# Patient Record
Sex: Male | Born: 1980
Health system: Southern US, Community
[De-identification: ages and names within clinical notes are randomized; demographics above are authoritative.]

## PROBLEM LIST (undated history)

## (undated) DIAGNOSIS — K859 Acute pancreatitis without necrosis or infection, unspecified: Secondary | ICD-10-CM

## (undated) DIAGNOSIS — D332 Benign neoplasm of brain, unspecified: Secondary | ICD-10-CM

## (undated) DIAGNOSIS — T7840XA Allergy, unspecified, initial encounter: Secondary | ICD-10-CM

## (undated) DIAGNOSIS — K5190 Ulcerative colitis, unspecified, without complications: Secondary | ICD-10-CM

## (undated) HISTORY — DX: Allergy, unspecified, initial encounter: T78.40XA

## (undated) HISTORY — PX: BRAIN SURGERY: SHX531

---

## 2019-01-26 ENCOUNTER — Other Ambulatory Visit: Payer: Self-pay

## 2019-01-26 ENCOUNTER — Encounter (HOSPITAL_COMMUNITY): Payer: Self-pay | Admitting: Emergency Medicine

## 2019-01-26 ENCOUNTER — Emergency Department (HOSPITAL_COMMUNITY)
Admission: EM | Admit: 2019-01-26 | Discharge: 2019-01-26 | Disposition: A | Discharge status: Home / Self Care | Payer: BLUE CROSS/BLUE SHIELD | Attending: Emergency Medicine | Admitting: Emergency Medicine

## 2019-01-26 DIAGNOSIS — J029 Acute pharyngitis, unspecified: Secondary | ICD-10-CM | POA: Diagnosis present

## 2019-01-26 DIAGNOSIS — J101 Influenza due to other identified influenza virus with other respiratory manifestations: Secondary | ICD-10-CM | POA: Diagnosis not present

## 2019-01-26 DIAGNOSIS — J111 Influenza due to unidentified influenza virus with other respiratory manifestations: Secondary | ICD-10-CM

## 2019-01-26 HISTORY — DX: Acute pancreatitis without necrosis or infection, unspecified: K85.90

## 2019-01-26 HISTORY — DX: Benign neoplasm of brain, unspecified: D33.2

## 2019-01-26 LAB — INFLUENZA PANEL BY PCR (TYPE A & B)
INFLBPCR: POSITIVE — AB
Influenza A By PCR: NEGATIVE

## 2019-01-26 MED ORDER — IBUPROFEN 800 MG PO TABS: 800.0000 mg | ORAL_TABLET | Freq: Three times a day (TID) | ORAL | 0 refills | Status: DC

## 2019-01-26 MED ORDER — PROMETHAZINE-DM 6.25-15 MG/5ML PO SYRP: 5.0000 mL | ORAL_SOLUTION | Freq: Four times a day (QID) | ORAL | 0 refills | Status: DC | PRN

## 2019-01-26 NOTE — ED Notes (Signed)
ED Provider at bedside. 

## 2019-01-26 NOTE — Discharge Instructions (Addendum)
It is important that you rest and drink plenty of fluids.  Take extra strength Tylenol every 4 hours for fever and body aches.  Cover your mouth when you cough or sneeze and avoid touching your face when possible.  Follow-up with your primary doctor for recheck or return to the ER for any worsening symptoms.

## 2019-01-26 NOTE — ED Provider Notes (Signed)
[facility] Provider Note   CSN: [id number] Arrival date & time: 01/26/19  1623     History   Chief Complaint Chief Complaint  Patient presents with  . Fever    HPI Cory James is a 38 y.o. male.  HPI   Cory James is a 38 y.o. male who presents to the Emergency Department complaining of generalized body aches, sore throat, fever, chills, and cough.  Symptoms began yesterday and worse today.  States his cough is nonproductive.  He reports intermittent shaking chills.  He has been taking over-the-counter [facility] cough drops to minimize his sore throat and cough.  He states his wife has similar symptoms and his job requires that he be exposed to the public.  He denies abdominal pain, vomiting, diarrhea, neck pain or stiffness, chest pain and shortness of breath.  He did not take a flu vaccine this season.   Past Medical History:  Diagnosis Date  . Brain tumor (benign) ([facility])   . Pancreatitis     There are no active problems to display for this patient.   Past Surgical History:  Procedure Laterality Date  . BRAIN SURGERY          Home Medications    Prior to Admission medications   Medication Sig Start Date End Date Taking? Authorizing Provider  ibuprofen (ADVIL,MOTRIN) 800 MG tablet Take 1 tablet (800 mg total) by mouth 3 (three) times daily. 01/26/19   Shali Vesey, PA-C  promethazine-dextromethorphan (PROMETHAZINE-DM) 6.25-15 MG/5ML syrup Take 5 mLs by mouth 4 (four) times daily as needed. 01/26/19   Kem Parkinson, PA-C    Family History Family History  Problem Relation Age of Onset  . Hypertension Mother   . Diabetes Mother     Social History Social History   Tobacco Use  . Smoking status: Never Smoker  . Smokeless tobacco: Never Used  Substance Use Topics  . Alcohol use: Yes    Comment: rarely  . Drug use: Never     Allergies   Patient has no known allergies.   Review of Systems Review of Systems  Constitutional:  Positive for chills and fever. Negative for activity change and appetite change.  HENT: Positive for congestion and sore throat. Negative for ear pain, facial swelling, rhinorrhea and trouble swallowing.   Eyes: Negative for visual disturbance.  Respiratory: Positive for cough. Negative for chest tightness, shortness of breath, wheezing and stridor.   Cardiovascular: Negative for chest pain.  Gastrointestinal: Negative for abdominal pain, nausea and vomiting.  Genitourinary: Negative for decreased urine volume and dysuria.  Musculoskeletal: Positive for myalgias (Generalized body aches). Negative for arthralgias, neck pain and neck stiffness.  Skin: Negative for rash.  Neurological: Negative for dizziness, weakness, numbness and headaches.  Hematological: Negative for adenopathy.  Psychiatric/Behavioral: Negative for confusion.     Physical Exam Updated Vital Signs BP (!) 142/96 (BP Location: Right Arm)   Pulse 93   Temp 99.1 F (37.3 C) (Oral)   Resp 18   Ht 5\' 10"  (1.778 m)   Wt 74.8 kg   SpO2 100%   BMI 23.68 kg/m   Physical Exam Vitals signs and nursing note reviewed.  Constitutional:      General: He is not in acute distress.    Appearance: Normal appearance. He is not ill-appearing.  HENT:     Head: Atraumatic.     Right Ear: Tympanic membrane and ear canal normal.     Left Ear: Tympanic membrane and ear canal normal.  Nose: Congestion present.     Mouth/Throat:     Mouth: Mucous membranes are moist.     Pharynx: Oropharynx is clear. No oropharyngeal exudate or posterior oropharyngeal erythema.  Neck:     Musculoskeletal: Normal range of motion. No neck rigidity.  Cardiovascular:     Rate and Rhythm: Normal rate and regular rhythm.     Pulses: Normal pulses.  Pulmonary:     Effort: Pulmonary effort is normal. No respiratory distress.     Breath sounds: Normal breath sounds. No wheezing or rhonchi.  Musculoskeletal: Normal range of motion.  Lymphadenopathy:       Cervical: No cervical adenopathy.  Skin:    General: Skin is warm.     Findings: No rash.  Neurological:     General: No focal deficit present.     Mental Status: He is alert. Mental status is at baseline.      ED Treatments / Results  Labs (all labs ordered are listed, but only abnormal results are displayed) Labs Reviewed  INFLUENZA PANEL BY PCR (TYPE A & B) - Abnormal; Notable for the following components:      Result Value   Influenza B By PCR POSITIVE (*)    All other components within normal limits    EKG None  Radiology No results found.  Procedures Procedures (including critical care time)  Medications Ordered in ED Medications - No data to display   Initial Impression / Assessment and Plan / ED Course  I have reviewed the triage vital signs and the nursing notes.  Pertinent labs & imaging results that were available during my care of the patient were reviewed by me and considered in my medical decision making (see chart for details).     Patient well-appearing.  Vital signs reviewed.  Flu testing is positive.  Patient counseled on hand hygiene.  He agrees to treatment plan with rest, fluids, and symptomatic treatment of cough with cough medication and ibuprofen for body aches and fever.  Patient appears appropriate for discharge home and agrees to plan.  Final Clinical Impressions(s) / ED Diagnoses   Final diagnoses:  Influenza    ED Discharge Orders         Ordered    promethazine-dextromethorphan (PROMETHAZINE-DM) 6.25-15 MG/5ML syrup  4 times daily PRN     01/26/19 1735    ibuprofen (ADVIL,MOTRIN) 800 MG tablet  3 times daily     01/26/19 1735           Kem Parkinson, PA-C 01/26/19 1747    Fredia Sorrow, MD 01/27/19 [phone]

## 2019-01-26 NOTE — ED Triage Notes (Signed)
Patient reports "high fever" since yesterday. Patient also reports cough.

## 2019-02-02 ENCOUNTER — Encounter: Payer: Self-pay | Admitting: Family Medicine

## 2019-02-02 ENCOUNTER — Ambulatory Visit (INDEPENDENT_AMBULATORY_CARE_PROVIDER_SITE_OTHER): Payer: BLUE CROSS/BLUE SHIELD | Admitting: Family Medicine

## 2019-02-02 VITALS — BP 102/66 | HR 52 | Resp 12 | Ht 70.0 in | Wt 165.1 lb

## 2019-02-02 DIAGNOSIS — Z23 Encounter for immunization: Secondary | ICD-10-CM

## 2019-02-02 DIAGNOSIS — R197 Diarrhea, unspecified: Secondary | ICD-10-CM

## 2019-02-02 DIAGNOSIS — K580 Irritable bowel syndrome with diarrhea: Secondary | ICD-10-CM | POA: Diagnosis not present

## 2019-02-02 MED ORDER — DICYCLOMINE HCL 20 MG PO TABS: 20.0000 mg | ORAL_TABLET | Freq: Three times a day (TID) | ORAL | 0 refills | Status: DC | PRN

## 2019-02-02 NOTE — Patient Instructions (Signed)
    Thank you for coming into the office today. I appreciate the opportunity to provide you with the care for your health and wellness.  Stop by the lab on the way out and get labs drawn.   1- pick up medication from pharmacy take as directed. On the bottle take for spasms, the Imodium is not working.  2-at pharmacy pick up Imodium take as directed on box. Do not exceed 16 mg daily or for longer than 10 day use.  3- at pharmacy pick up probiotics take once daily  4- STAY HYDRATED  I hope you feel better.   It was a pleasure to see you and I look forward to continuing to work together on your health and well-being. Please do not hesitate to call the office if you need care or have questions about your care.  Have a wonderful day and week.  With Gratitude,  Cherly Beach, DNP, AGNP-BC

## 2019-02-02 NOTE — Progress Notes (Signed)
New Patient Office Visit  Subjective:  Patient ID: Cory James, male    DOB: 1981-10-15  Age: 38 y.o. MRN: [id number]  CC:  Chief Complaint  Patient presents with  . New Patient (Initial Visit)    establish care    HPI Cory James is a 38 year old male who presents for new patient visit to the office.  Had to wait for insurance to that he can find a PCP to get treatment for concerns of diarrhea today. He is currently having 7-8 stools a day.  Half of those being at work.  This is impacting his ability to work.  This started back in November 2019.  Unsure of what set it off.  Does not believe it is related to anything that he eats because everything he eats makes him have to go to the bathroom.    More recently he has now started having more explosive diarrhea and noticing blood.  The first signs of blood were little bit darker.  They now appear to be a little bit brighter.  He reports that it is in the stool itself.  He does not report that is on the toilet paper as much.  He denies any mucus or floating stool.  His history of bowel movements consist of usually having 3 short bowel movements in the morning.  This was his normal for years.  The rest of the day was usually controlled.  He denies fever, pain, gas, bloating, headache, chest pain, shortness of breath, or rapid heart rate.  Has not tried anything to modify this over-the-counter.  Was concerned with the blood started.  Was not sure what he should take.  Denies that this is related to any anxiety.  Feels like all foods impacted.  Has noticed more solid pieces of food in stool.  Mostly liquidy or soft.  Usually semi formed.  Post having pancreatitis last year he had work to change his diet.  He had started intermittent fasting.  Reduce the amount of oils and fried foods and eating out.  He now practices a diet that is predominantly vegetarian.  Occasionally will eat out, Janine Limbo, pizza.  Does drink a lot of water.  Reports that  he does not want to work out right now because he is worried that it will impact him to have to go to the bathroom alot.   History of having pancreatitis last summer 2019 was diagnosed in [place] by his PCP.  Changed his diet did improve. He does not recall having any medicine at the time.  2018 he had a benign brain tumor removed.  This resulted in him losing his hearing on the right side.  He also has numbness through his neck and the right side of the face.  He reports mild headaches occasionally.  He also reports that he has had increasing forgetfulness since the tumor was removed.  He was cleared of any health concerns post removal and surgery healing.  Also reports having some sinus issues.  But is a smoker.  Stop smoking actual cigarettes.  But is now smoking the [employer].  Would like to quit some time.  Recently moved here from [place].  Last October 2019.  Lives with wife and 2 children.  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  Reports investing most of his income into this so that they can move down here.  Reports having some worry about where they live currently.  Reports questionable drug use  in the area.  But does not have the money at this time to move them but is working towards savings.  Admits to be in an intermittent rare marijuana smoker.  Has been trying to cease that.  Does not smoke around his children.  Unsure of exactly when the last time he did smoke.    Past Medical History:  Diagnosis Date  . Allergy    seasonal  . Brain tumor (benign) ([facility])   . Pancreatitis     Past Surgical History:  Procedure Laterality Date  . BRAIN SURGERY      Family History  Problem Relation Age of Onset  . Hypertension Mother   . Diabetes Mother   . Healthy Father   . Healthy Sister     Social History   Socioeconomic History  . Marital status: Married    Spouse name: [name]  . Number of children: 2  . Years of education: 38  . Highest education  level: GED or equivalent  Occupational History  . Not on file  Social Needs  . Financial resource strain: Not hard at all  . Food insecurity:    Worry: Never true    Inability: Never true  . Transportation needs:    Medical: No    Non-medical: No  Tobacco Use  . Smoking status: Current Every Day Smoker    Packs/day: 0.50    Years: 18.00    Pack years: 9.00    Types: Cigarettes, E-cigarettes  . Smokeless tobacco: Never Used  Substance and Sexual Activity  . Alcohol use: Not Currently    Comment: rarely  . Drug use: Not Currently    Types: Marijuana    Comment: occassion use  . Sexual activity: Yes  Lifestyle  . Physical activity:    Days per week: 0 days    Minutes per session: 0 min  . Stress: Very much  Relationships  . Social connections:    Talks on phone: More than three times a week    Gets together: Twice a week    Attends religious service: Never    Active member of club or organization: No    Attends meetings of clubs or organizations: Never    Relationship status: Married  . Intimate partner violence:    Fear of current or ex partner: No    Emotionally abused: No    Physically abused: No    Forced sexual activity: No  Other Topics Concern  . Not on file  Social History Narrative   Married for 10 years to [place]      Two Children:   2020      [name] 55 years old      [place] 77 years old      No pets     ROS Review of Systems  Constitutional: Positive for fatigue. Negative for activity change, appetite change, chills and fever.  HENT: Positive for rhinorrhea.        Allergy related  Respiratory: Positive for shortness of breath. Negative for cough and chest tightness.        Occasional shortness of breath at times  Cardiovascular: Positive for chest pain. Negative for palpitations.  Gastrointestinal: Positive for blood in stool and diarrhea. Negative for abdominal distention, abdominal pain, anal bleeding, nausea, rectal pain and vomiting.    Endocrine: Negative for polydipsia, polyphagia and polyuria.  Genitourinary: Negative.  Negative for hematuria.  Skin: Negative.   Neurological: Negative for dizziness and headaches.  Reports having numbness and loss of sensation on the right side of of face,  loss of hearing right ear  Reports this is secondary to the brain tumor removal having to cut nerves to get to the tumor  Psychiatric/Behavioral: Negative for decreased concentration. The patient is not nervous/anxious.        Reports mild worry  Reports forgetfulness secondary to having brain tumor removed in 2018.    Objective:   Today's Vitals: BP 102/66   Pulse (!) 52   Resp 12   Ht 5\' 10"  (1.778 m)   Wt 165 lb 1.9 oz (74.9 kg)   SpO2 100% Comment: room air  BMI 23.69 kg/m   Physical Exam Vitals signs and nursing note reviewed.  Constitutional:      Appearance: Normal appearance. He is normal weight.  HENT:     Head: Normocephalic.     Right Ear: External ear normal.     Left Ear: External ear normal.     Nose: Nose normal.  Eyes:     Conjunctiva/sclera: Conjunctivae normal.  Cardiovascular:     Rate and Rhythm: Regular rhythm. Bradycardia present.     Comments: Bradycardia in the 50s Pulmonary:     Effort: Pulmonary effort is normal.     Breath sounds: Normal breath sounds.  Abdominal:     General: Bowel sounds are normal.     Palpations: Abdomen is soft.     Tenderness: There is no abdominal tenderness.  Neurological:     Mental Status: He is alert and oriented to person, place, and time.  Psychiatric:        Mood and Affect: Mood normal.        Behavior: Behavior normal.        Thought Content: Thought content normal.        Judgment: Judgment normal.     Assessment & Plan:   1. Diarrhea, unspecified type Questionable unspecified type of diarrhea that is currently going on.  Will refer to GI to help with further evaluation and assessment.  Will order CBC due to the nature of having bleeding  and unsure of how much and what kind.  Along with occasional shortness of breath that is new.  - Ambulatory referral to Gastroenterology - CBC  2. Need for immunization against influenza In need of the flu vaccine.  Provided this today in office.  - Flu Vaccine QUAD 36+ mos IM  3. Irritable bowel syndrome with diarrhea Due to the nature and the description and evaluation today the signs and symptoms are conclusive of possibly having irritable bowel syndrome with  diarrhea being the predominant symptom. He has not tried anything yet over-the-counter.  Educated about the use of Imodium and probiotics.  Due to him having the inability to miss work.  And this interfering tremendously throughout his day.  Provided him with a prescription of Bentyl to take if the Imodium was not working.  Also referring to GI. Will be drawing labs for CBC and CMP to assess what diarrhea could be having impact on his electrolyte level and his blood work since he has reports of having bleeding.  Educated about the use of Imodium, not to exceed 16 mg a day or for longer than 10-day use.  Take as directed on the box.  Instructed and educated about the use of Bentyl.  Only to be using the Bentyl if the Imodium is not working for him.  Instructed him to get probiotics as well to take  daily.  And to stay hydrated.  Appreciate GIs input and help with figuring out if this is indeed IBS with diarrhea.  Or something else going on.   - dicyclomine (BENTYL) 20 MG tablet; Take 1 tablet (20 mg total) by mouth 3 (three) times daily as needed for spasms.  Dispense: 42 tablet; Refill: 0 - COMPLETE METABOLIC PANEL WITH GFR     Follow-up: Instructed him to follow-up as needed or after GI appointment.  Perlie Mayo, NP

## 2019-02-06 LAB — COMPLETE METABOLIC PANEL WITH GFR
AG Ratio: 1.1 (calc) (ref 1.0–2.5)
ALT: 25 U/L (ref 9–46)
AST: 17 U/L (ref 10–40)
Albumin: 3.7 g/dL (ref 3.6–5.1)
Alkaline phosphatase (APISO): 70 U/L (ref 36–130)
BUN: 10 mg/dL (ref 7–25)
CO2: 26 mmol/L (ref 20–32)
Calcium: 8.7 mg/dL (ref 8.6–10.3)
Chloride: 107 mmol/L (ref 98–110)
Creat: 0.91 mg/dL (ref 0.60–1.35)
GFR, EST NON AFRICAN AMERICAN: 107 mL/min/{1.73_m2} (ref >=60)
GFR, Est African American: 123 mL/min/{1.73_m2} (ref >=60)
GLOBULIN: 3.3 g/dL (ref 1.9–3.7)
Glucose, Bld: 117 mg/dL (ref 65–139)
Potassium: 4.5 mmol/L (ref 3.5–5.3)
SODIUM: 140 mmol/L (ref 135–146)
Total Bilirubin: 0.3 mg/dL (ref 0.2–1.2)
Total Protein: 7 g/dL (ref 6.1–8.1)

## 2019-02-06 LAB — CBC
HCT: 36.1 % — ABNORMAL LOW (ref 38.5–50.0)
Hemoglobin: 11.9 g/dL — ABNORMAL LOW (ref 13.2–17.1)
MCH: 26.3 pg — ABNORMAL LOW (ref 27.0–33.0)
MCHC: 33 g/dL (ref 32.0–36.0)
MCV: 79.9 fL — ABNORMAL LOW (ref 80.0–100.0)
MPV: 11.1 fL (ref 7.5–12.5)
Platelets: 316 10*3/uL (ref 140–400)
RBC: 4.52 10*6/uL (ref 4.20–5.80)
RDW: 12.6 % (ref 11.0–15.0)
WBC: 11.3 10*3/uL — ABNORMAL HIGH (ref 3.8–10.8)

## 2019-02-06 NOTE — Addendum Note (Signed)
Addended by: Obie Dredge A on: 02/06/2019 04:58 PM   Modules accepted: Orders

## 2019-02-07 ENCOUNTER — Telehealth: Payer: Self-pay

## 2019-02-07 NOTE — Telephone Encounter (Signed)
Called and spoke with patient, advised him of the stool test Jarrett Soho had ordered for him and gave instructions on where to go to get the specimen cup and instructions. Patient verbally acknowledged understanding

## 2019-02-19 ENCOUNTER — Ambulatory Visit (INDEPENDENT_AMBULATORY_CARE_PROVIDER_SITE_OTHER): Payer: BLUE CROSS/BLUE SHIELD | Admitting: Internal Medicine

## 2019-02-19 ENCOUNTER — Ambulatory Visit (INDEPENDENT_AMBULATORY_CARE_PROVIDER_SITE_OTHER): Payer: PRIVATE HEALTH INSURANCE | Admitting: Internal Medicine

## 2019-02-19 ENCOUNTER — Encounter (INDEPENDENT_AMBULATORY_CARE_PROVIDER_SITE_OTHER): Payer: Self-pay | Admitting: Internal Medicine

## 2019-02-19 VITALS — BP 116/77 | HR 73 | Temp 98.1°F | Resp 18 | Ht 70.0 in | Wt 169.3 lb

## 2019-02-19 DIAGNOSIS — R197 Diarrhea, unspecified: Secondary | ICD-10-CM

## 2019-02-19 DIAGNOSIS — K580 Irritable bowel syndrome with diarrhea: Secondary | ICD-10-CM | POA: Diagnosis not present

## 2019-02-19 DIAGNOSIS — D649 Anemia, unspecified: Secondary | ICD-10-CM

## 2019-02-19 NOTE — Progress Notes (Signed)
Reason for consultation;  Diarrhea.  History of present illness:  Patient is 38 year old male who is been referred through courtesy of Ms. Cherly Beach, NP for GI evaluation. Patient presents with 3 months history of diarrhea.  He says prior to that he had frequent urination.  As urinary frequency has decreased he has developed diarrhea.  He is having anywhere from 5-6 stools per day.  Most of his bowel movements occur within few minutes of his meals and he also wakes up twice every night to have a bowel movement.  Some of his bowel movements are explosive.  On the [facility] stool chart he gives a score of 5, 6 and 7.  Also has been passing small amount of fresh blood with his bowel movements but not every day.  He has not experienced pain or cramping except when he took loperamide last week.  His appetite is good.  He has not lost any weight this year.  Last year when he was diagnosed with pancreatitis in July 2019 he decided to improve his eating habits and he also did intermittent fasting and lost 26 pounds. No history of recent antibiotic use or travel outside the country. He has been under a lot of stress since relocating to this area last year.  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 Review of the systems is negative for nausea vomiting fever chills or night sweats.  He does not take OTC NSAIDs. Family history is negative for inflammatory bowel disease. He saw Ms. Cherly Beach, NP about 2 weeks ago.  He has not been able to provide stool specimens yet.  He states when he took Imodium he began to have normal bowel movements but he developed abdominal pain which he describes to be aching and some bloating.  He has not she had taken dicyclomine.   Current Medications: Outpatient Encounter Medications as of 02/19/2019  Medication Sig  . loperamide (IMODIUM) 2 MG capsule Take by mouth as needed for diarrhea or loose stools.  . dicyclomine (BENTYL) 20 MG tablet Take  0.5 tablets (10 mg total) by mouth 3 (three) times daily as needed.  . [DISCONTINUED] dicyclomine (BENTYL) 20 MG tablet Take 1 tablet (20 mg total) by mouth 3 (three) times daily as needed for spasms. (Patient not taking: Reported on 02/19/2019)   No facility-administered encounter medications on file as of 02/19/2019.    Past medical history:  Surgery for benign brain tumor in October 2018 while he was living in [place]. Treated for acute pancreatitis in July 2019 when he presented with chest pain.  Etiology unknown. He was treated for influenza B last month.  Allergies:  No Known Allergies  Family history:  Father is 65 years old and in good health. Mother is 66 year old with multiple health problems including hypertension diabetes and she also had history of paralysis from which she has recovered.  He has one sister age 21 in good health other than chronic constipation.  Social history:  He is married.  He has son age 37 and daughter age 53 in good health.  Both children are in good health.  He immigrated from [place] in 1991.  In the state of [place] for 10 years.  He works in a casino.  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  His wife works 7 days a week.  Smokes cigarettes for 15 years no more than half a pack per  day and now smoking E cigarettes. He drinks alcohol no more than 2-3 drinks a month.   Physical examination:  Blood pressure 116/77, pulse 73, temperature 98.1 F (36.7 C), temperature source Oral, resp. rate 18, height 5' 10"  (1.778 m), weight 169 lb 4.8 oz (76.8 kg).  Patient is alert and in no acute distress. He has scar in right occipital region partially covered by hair. Conjunctiva is pink. Sclera is nonicteric Oropharyngeal mucosa is normal. No neck masses or thyromegaly noted. Cardiac exam with regular rhythm normal S1 and S2. No murmur or gallop noted. Lungs are clear to auscultation. Abdomen is  symmetrical.  Bowel sounds are normal.  On palpation abdomen is soft and nontender with organomegaly or masses. No LE edema or clubbing noted.  Labs/studies Results:  CBC Latest Ref Rng & Units 02/05/2019  WBC 3.8 - 10.8 Thousand/uL 11.3(H)  Hemoglobin 13.2 - 17.1 g/dL 11.9(L)  Hematocrit 38.5 - 50.0 % 36.1(L)  Platelets 140 - 400 Thousand/uL 316    CMP Latest Ref Rng & Units 02/05/2019  Glucose 65 - 139 mg/dL 117  BUN 7 - 25 mg/dL 10  Creatinine 0.60 - 1.35 mg/dL 0.91  Sodium 135 - 146 mmol/L 140  Potassium 3.5 - 5.3 mmol/L 4.5  Chloride 98 - 110 mmol/L 107  CO2 20 - 32 mmol/L 26  Calcium 8.6 - 10.3 mg/dL 8.7  Total Protein 6.1 - 8.1 g/dL 7.0  Total Bilirubin 0.2 - 1.2 mg/dL 0.3  AST 10 - 40 U/L 17  ALT 9 - 46 U/L 25    Hepatic Function Latest Ref Rng & Units 02/05/2019  Total Protein 6.1 - 8.1 g/dL 7.0  AST 10 - 40 U/L 17  ALT 9 - 46 U/L 25  Total Bilirubin 0.2 - 1.2 mg/dL 0.3     Assessment:  #1.  Patient is 38 year old male who presents with 65-monthhistory of postprandial and nocturnal diarrhea associated with rectal bleeding but no abdominal pain or weight loss.  No history of recent antibiotic use or travel outside the country.  He has been under a lot of [place] working 7 days a week.  Presentation is suggestive of irritable bowel syndrome but rectal bleeding is very similar as he has developed hemorrhoids.  If stool studies are negative he will need diagnostic colonoscopy.  #2.  Anemia.  MCV is low normal.  Anemia may be secondary to GI blood loss in setting of diarrhea and rectal bleeding.  H&H will be repeated in the next few weeks.  #3.  History of pancreatitis.  It is unclear as to the etiology.  Recommendations:  Patient advised to hold off loperamide and do do not start dicyclomine until he has provided stool samples to the lab. Once he has provided stool sample to the lab he can start  dicyclomine 10 mg before breakfast and lunch and a third dose on as-needed basis. He can use loperamide OTC 2 mg once or twice daily as needed if dicyclomine does not control his diarrhea. Request records pertaining to evaluation for episode of pancreatitis last year. Further recommendations will be made when results of stool studies available.

## 2019-02-19 NOTE — Patient Instructions (Signed)
Take dicyclomine/Bentyl 10 mg by mouth 30 minutes before breakfast and lunch and take third dose on as-needed basis. Take Imodium or loperamide 2 mg once or twice daily as needed. Physician will contact you with results of stool studies when completed. Will request records pertaining to admission for pancreatitis from hospital in [place].

## 2019-04-10 ENCOUNTER — Telehealth (INDEPENDENT_AMBULATORY_CARE_PROVIDER_SITE_OTHER): Payer: Self-pay | Admitting: Internal Medicine

## 2019-04-10 LAB — STOOL CULTURE
MICRO NUMBER:: 404428
MICRO NUMBER:: 404429
MICRO NUMBER:: 404430
SHIGA RESULT:: NOT DETECTED
SPECIMEN QUALITY:: ADEQUATE
SPECIMEN QUALITY:: ADEQUATE
SPECIMEN QUALITY:: ADEQUATE

## 2019-04-10 LAB — OVA AND PARASITE EXAMINATION
CONCENTRATE RESULT:: NONE SEEN
MICRO NUMBER:: 404353
SPECIMEN QUALITY:: ADEQUATE
TRICHROME RESULT:: NONE SEEN

## 2019-04-10 NOTE — Telephone Encounter (Signed)
Patient left message stating he hasn't heard from his stool card results  - ph# [phone]

## 2019-04-10 NOTE — Progress Notes (Signed)
Appears that the stool was clear of ova or parasites. This does not completely rule out has it is only one sample-per comment.  See if symptoms have improved. Is being seen by GI now as well.

## 2019-05-02 NOTE — Telephone Encounter (Signed)
There was no stool card brought in. Per Dr.Rehman at the time of office visit - see below  Patient advised to hold off loperamide and do do not start dicyclomine until he has provided stool samples to the lab. Once he has provided stool sample to the lab he can start dicyclomine 10 mg before breakfast and lunch and a third dose on as-needed basis. He can use loperamide OTC 2 mg once or twice daily as needed if dicyclomine does not control his diarrhea. Request records pertaining to evaluation for episode of pancreatitis last year. Further recommendations will be made when results of stool studies available.  It appears in PCP noted that Ms. Cory James the NP reviewed those results with the patient.

## 2019-11-05 ENCOUNTER — Other Ambulatory Visit: Payer: Self-pay | Admitting: Cardiology

## 2019-11-05 ENCOUNTER — Encounter: Payer: Self-pay | Admitting: Cardiology

## 2019-11-05 DIAGNOSIS — Z20822 Contact with and (suspected) exposure to covid-19: Secondary | ICD-10-CM

## 2019-11-07 LAB — NOVEL CORONAVIRUS, NAA: SARS-CoV-2, NAA: NOT DETECTED

## 2019-11-18 ENCOUNTER — Other Ambulatory Visit: Payer: Self-pay

## 2019-11-18 ENCOUNTER — Emergency Department (HOSPITAL_COMMUNITY)
Admission: EM | Admit: 2019-11-18 | Discharge: 2019-11-18 | Disposition: A | Discharge status: Home / Self Care | Payer: BC Managed Care – PPO | Attending: Emergency Medicine | Admitting: Emergency Medicine

## 2019-11-18 ENCOUNTER — Encounter (HOSPITAL_COMMUNITY): Payer: Self-pay

## 2019-11-18 DIAGNOSIS — M545 Low back pain, unspecified: Secondary | ICD-10-CM

## 2019-11-18 DIAGNOSIS — F17290 Nicotine dependence, other tobacco product, uncomplicated: Secondary | ICD-10-CM | POA: Diagnosis not present

## 2019-11-18 MED ORDER — METHOCARBAMOL 500 MG PO TABS: 500.0000 mg | ORAL_TABLET | Freq: Three times a day (TID) | ORAL | 0 refills | Status: DC | PRN

## 2019-11-18 MED ORDER — NAPROXEN 500 MG PO TABS: 500.0000 mg | ORAL_TABLET | Freq: Two times a day (BID) | ORAL | 0 refills | Status: DC

## 2019-11-18 MED ORDER — LIDOCAINE 5 % EX PTCH: 1.0000 | MEDICATED_PATCH | CUTANEOUS | 0 refills | Status: DC

## 2019-11-18 NOTE — ED Triage Notes (Signed)
Pt presents to ED with complaints of lower back pain x 1 week. Pt states it radiates down left leg.

## 2019-11-18 NOTE — ED Notes (Signed)
Back pain unrelieved by patches and OTC meds  Also reports lower back pain with numbness to L inner thigh  Denies incontinence

## 2019-11-18 NOTE — ED Provider Notes (Signed)
[facility] EMERGENCY DEPARTMENT Provider Note   CSN: [id number] Arrival date & time: 11/18/19  1432     History   Chief Complaint Chief Complaint  Patient presents with   Back Pain    HPI Cory James is a 38 y.o. male with a hx of tobacco abuse & pancreatitis who presents to the ED with complaints of back pain for the past 1 week.  Patient states pain is located to the diffuse lower back, it is constant, worse with certain positions and movements, no alleviating factors.  He has tried Tylenol, Goody powder, and icy hot without relief.  He states he does a lot of heavy lifting daily, no specific traumatic injury.  He had pain similar to this a couple of years ago which improved with a muscle relaxant.  The pain at times radiates into the left leg. Denies numbness, tingling, weakness, saddle anesthesia, incontinence to bowel/bladder, fever, chills, IV drug use, dysuria, or hx of malignant cancer (states had benign brain tumor). Patient has not had prior back surgeries.       HPI  Past Medical History:  Diagnosis Date   Allergy    seasonal   Brain tumor (benign) ([place])    Pancreatitis     There are no active problems to display for this patient.   Past Surgical History:  Procedure Laterality Date   BRAIN SURGERY          Home Medications    Prior to Admission medications   Medication Sig Start Date End Date Taking? Authorizing Provider  dicyclomine (BENTYL) 20 MG tablet Take 0.5 tablets (10 mg total) by mouth 3 (three) times daily as needed. 02/19/19   Rogene Houston, MD  loperamide (IMODIUM) 2 MG capsule Take by mouth as needed for diarrhea or loose stools.    [provider]    Family History Family History  Problem Relation Age of Onset   Hypertension Mother    Diabetes Mother    Healthy Father    Healthy Sister     Social History Social History   Tobacco Use   Smoking status: Current Every Day Smoker    Packs/day: 0.50   Years: 18.00    Pack years: 9.00    Types: E-cigarettes   Smokeless tobacco: Never Used  Substance Use Topics   Alcohol use: Not Currently    Comment: rarely   Drug use: Not Currently    Types: Marijuana    Comment: occassion use     Allergies   Patient has no known allergies.   Review of Systems Review of Systems  Constitutional: Negative for chills, fever and unexpected weight change.  Gastrointestinal: Negative for abdominal pain, nausea and vomiting.  Genitourinary: Negative for dysuria.  Musculoskeletal: Positive for back pain.  Neurological: Negative for weakness and numbness.       Negative for saddle anesthesia or bowel/bladder incontinence.      Physical Exam Updated Vital Signs BP (!) 154/90 (BP Location: Right Arm)    Pulse 75    Temp 99.1 F (37.3 C) (Oral)    Resp 16    Ht 5' 10"  (1.778 m)    Wt 79.4 kg    SpO2 100%    BMI 25.11 kg/m   Physical Exam Constitutional:      General: He is not in acute distress.    Appearance: He is well-developed. He is not toxic-appearing.  HENT:     Head: Normocephalic and atraumatic.  Neck:  Musculoskeletal: Normal range of motion and neck supple. No spinous process tenderness or muscular tenderness.  Musculoskeletal:     Comments: No obvious deformity, appreciable swelling, erythema, ecchymosis, significant open wounds, or increased warmth.  Extremities: Normal ROM. Nontender.  Back: No point/focal vertebral tenderness, no palpable step off or crepitus.  Patient has bilateral lumbar paraspinal muscle tenderness.  Skin:    General: Skin is warm and dry.     Findings: No rash.  Neurological:     Mental Status: He is alert.     Deep Tendon Reflexes:     Reflex Scores:      Patellar reflexes are 2+ on the right side and 2+ on the left side.    Comments: Sensation grossly intact to bilateral lower extremities. 5/5 symmetric strength with plantar/dorsiflexion bilaterally. Gait is intact without obvious foot drop.      ED Treatments / Results  Labs (all labs ordered are listed, but only abnormal results are displayed) Labs Reviewed - No data to display  EKG None  Radiology No results found.  Procedures Procedures (including critical care time)  Medications Ordered in ED Medications - No data to display   Initial Impression / Assessment and Plan / ED Course  I have reviewed the triage vital signs and the nursing notes.  Pertinent labs & imaging results that were available during my care of the patient were reviewed by me and considered in my medical decision making (see chart for details).  Patient presents with complaint of back pain.  Patient is nontoxic appearing, vitals are WNL. Patient has normal neurologic exam, no point/focal midline tenderness to palpation. He is ambulatory in the ED.  No back pain red flags. No urinary sxs. Most likely muscle strain versus spasm. Considered disc disease, UTI/pyelonephritis, kidney stone, aortic aneurysm/dissection, cauda equina or epidural abscess however these do not fit clinical picture at this time. Will treat with Lidoderm, Naproxen, and Robaxin, discussed with patient that they are not to drive or operate heavy machinery while taking Robaxin. I discussed treatment plan, need for PCP follow-up, and return precautions with the patient. Provided opportunity for questions, patient confirmed understanding and is in agreement with plan.    Final Clinical Impressions(s) / ED Diagnoses   Final diagnoses:  Acute bilateral low back pain, unspecified whether sciatica present    ED Discharge Orders         Ordered    naproxen (NAPROSYN) 500 MG tablet  2 times daily     11/18/19 1614    methocarbamol (ROBAXIN) 500 MG tablet  Every 8 hours PRN     11/18/19 1614    lidocaine (LIDODERM) 5 %  Every 24 hours     11/18/19 1614           Odester Nilson, Milan R, PA-C 11/18/19 1615    Virgel Manifold, MD 11/18/19 1655

## 2019-11-18 NOTE — Discharge Instructions (Signed)
You were seen in the emergency department for back pain today.  At this time we suspect that your pain is related to a muscle strain/spasm.   I have prescribed you an anti-inflammatory medication and a muscle relaxer.  - Naproxen is a nonsteroidal anti-inflammatory medication that will help with pain and swelling. Be sure to take this medication as prescribed with food, 1 pill every 12 hours,  It should be taken with food, as it can cause stomach upset, and more seriously, stomach bleeding. Do not take other nonsteroidal anti-inflammatory medications with this such as Advil, Motrin, Aleve, Mobic, Goodie Powder, or Motrin.    - Robaxin is the muscle relaxer I have prescribed, this is meant to help with muscle tightness. Be aware that this medication may make you drowsy therefore the first time you take this it should be at a time you are in an environment where you can rest. Do not drive or operate heavy machinery when taking this medication. Do not drink alcohol or take other sedating medications with this medicine such as narcotics or benzodiazepines.   - Lidoderm patch-this is a topical patch to help with numbing/soothing the area, apply 1 patch to the lower back once per day  You make take Tylenol per over the counter dosing with these medications.   We have prescribed you new medication(s) today. Discuss the medications prescribed today with your pharmacist as they can have adverse effects and interactions with your other medicines including over the counter and prescribed medications. Seek medical evaluation if you start to experience new or abnormal symptoms after taking one of these medicines, seek care immediately if you start to experience difficulty breathing, feeling of your throat closing, facial swelling, or rash as these could be indications of a more serious allergic reaction   The application of heat can help soothe the pain.   You will need to follow up with  Your primary healthcare  provider in 1-2 weeks for reassessment, if you do not have a primary care provider one is provided in your discharge instructions- you may see the [place] [facility] or call the provided phone number. However return to the ER should you develop ne or worsening symptoms or any other concerns including but not limited to severe or worsening pain, low back pain with fever, numbness, weakness, loss of bowel or bladder control, or inability to walk or urinate, you should return to the ER immediately.

## 2019-11-28 ENCOUNTER — Encounter: Payer: Self-pay | Admitting: Family Medicine

## 2019-11-28 ENCOUNTER — Other Ambulatory Visit: Payer: Self-pay

## 2019-11-28 ENCOUNTER — Ambulatory Visit: Payer: BC Managed Care – PPO | Admitting: Family Medicine

## 2019-11-28 ENCOUNTER — Ambulatory Visit (INDEPENDENT_AMBULATORY_CARE_PROVIDER_SITE_OTHER): Payer: BC Managed Care – PPO | Admitting: Family Medicine

## 2019-11-28 VITALS — BP 130/80 | HR 83 | Temp 97.6°F | Resp 15 | Ht 70.0 in | Wt 182.1 lb

## 2019-11-28 DIAGNOSIS — Z23 Encounter for immunization: Secondary | ICD-10-CM

## 2019-11-28 DIAGNOSIS — K580 Irritable bowel syndrome with diarrhea: Secondary | ICD-10-CM | POA: Diagnosis not present

## 2019-11-28 DIAGNOSIS — M545 Low back pain, unspecified: Secondary | ICD-10-CM

## 2019-11-28 MED ORDER — CYCLOBENZAPRINE HCL 5 MG PO TABS: 5.0000 mg | ORAL_TABLET | Freq: Two times a day (BID) | ORAL | 0 refills | Status: DC | PRN

## 2019-11-28 MED ORDER — DICYCLOMINE HCL 20 MG PO TABS: 10.0000 mg | ORAL_TABLET | Freq: Three times a day (TID) | ORAL | 0 refills | Status: DC | PRN

## 2019-11-28 NOTE — Patient Instructions (Addendum)
I appreciate the opportunity to provide you with care for your health and wellness. Today we discussed: back pain  Follow up: 4-6  weeks   No labs or referrals today  Flu shot today-use tylenol if arm gets sore.  I have attached some exercises for back strengthening.  Please read and try to work on these. Listen to your body, if it is too much, stop and try again the next day. Stretches can help with muscle tightness. Avoid lifting anything over 10 pounds for the rest of the week.  I hope you have a wonderful, happy, safe, and healthy Holiday Season! See you in the New Year :)  Please continue to practice social distancing to keep you, your family, and our community safe.  If you must go out, please wear a mask and practice good handwashing.  It was a pleasure to see you and I look forward to continuing to work together on your health and well-being. Please do not hesitate to call the office if you need care or have questions about your care.  Have a wonderful day and week. With Gratitude, Cherly Beach, DNP, AGNP-BC   Back Exercises These exercises help to make your trunk and back strong. They also help to keep the lower back flexible. Doing these exercises can help to prevent back pain or lessen existing pain.  If you have back pain, try to do these exercises 2-3 times each day or as told by your doctor.  As you get better, do the exercises once each day. Repeat the exercises more often as told by your doctor.  To stop back pain from coming back, do the exercises once each day, or as told by your doctor. Exercises Single knee to chest Do these steps 3-5 times in a row for each leg: 1. Lie on your back on a firm bed or the floor with your legs stretched out. 2. Bring one knee to your chest. 3. Grab your knee or thigh with both hands and hold them it in place. 4. Pull on your knee until you feel a gentle stretch in your lower back or buttocks. 5. Keep doing the stretch for  10-30 seconds. 6. Slowly let go of your leg and straighten it. Pelvic tilt Do these steps 5-10 times in a row: 1. Lie on your back on a firm bed or the floor with your legs stretched out. 2. Bend your knees so they point up to the ceiling. Your feet should be flat on the floor. 3. Tighten your lower belly (abdomen) muscles to press your lower back against the floor. This will make your tailbone point up to the ceiling instead of pointing down to your feet or the floor. 4. Stay in this position for 5-10 seconds while you gently tighten your muscles and breathe evenly. Cat-cow Do these steps until your lower back bends more easily: 1. Get on your hands and knees on a firm surface. Keep your hands under your shoulders, and keep your knees under your hips. You may put padding under your knees. 2. Let your head hang down toward your chest. Tighten (contract) the muscles in your belly. Point your tailbone toward the floor so your lower back becomes rounded like the back of a cat. 3. Stay in this position for 5 seconds. 4. Slowly lift your head. Let the muscles of your belly relax. Point your tailbone up toward the ceiling so your back forms a sagging arch like the back of a cow. 5.  Stay in this position for 5 seconds.  Press-ups Do these steps 5-10 times in a row: 1. Lie on your belly (face-down) on the floor. 2. Place your hands near your head, about shoulder-width apart. 3. While you keep your back relaxed and keep your hips on the floor, slowly straighten your arms to raise the top half of your body and lift your shoulders. Do not use your back muscles. You may change where you place your hands in order to make yourself more comfortable. 4. Stay in this position for 5 seconds. 5. Slowly return to lying flat on the floor.  Bridges Do these steps 10 times in a row: 1. Lie on your back on a firm surface. 2. Bend your knees so they point up to the ceiling. Your feet should be flat on the floor.  Your arms should be flat at your sides, next to your body. 3. Tighten your butt muscles and lift your butt off the floor until your waist is almost as high as your knees. If you do not feel the muscles working in your butt and the back of your thighs, slide your feet 1-2 inches farther away from your butt. 4. Stay in this position for 3-5 seconds. 5. Slowly lower your butt to the floor, and let your butt muscles relax. If this exercise is too easy, try doing it with your arms crossed over your chest. Belly crunches Do these steps 5-10 times in a row: 1. Lie on your back on a firm bed or the floor with your legs stretched out. 2. Bend your knees so they point up to the ceiling. Your feet should be flat on the floor. 3. Cross your arms over your chest. 4. Tip your chin a little bit toward your chest but do not bend your neck. 5. Tighten your belly muscles and slowly raise your chest just enough to lift your shoulder blades a tiny bit off of the floor. Avoid raising your body higher than that, because it can put too much stress on your low back. 6. Slowly lower your chest and your head to the floor. Back lifts Do these steps 5-10 times in a row: 1. Lie on your belly (face-down) with your arms at your sides, and rest your forehead on the floor. 2. Tighten the muscles in your legs and your butt. 3. Slowly lift your chest off of the floor while you keep your hips on the floor. Keep the back of your head in line with the curve in your back. Look at the floor while you do this. 4. Stay in this position for 3-5 seconds. 5. Slowly lower your chest and your face to the floor. Contact a doctor if:  Your back pain gets a lot worse when you do an exercise.  Your back pain does not get better 2 hours after you exercise. If you have any of these problems, stop doing the exercises. Do not do them again unless your doctor says it is okay. Get help right away if:  You have sudden, very bad back pain. If  this happens, stop doing the exercises. Do not do them again unless your doctor says it is okay. This information is not intended to replace advice given to you by your health care provider. Make sure you discuss any questions you have with your health care provider. Document Released: 01/08/2011 Document Revised: 08/31/2018 Document Reviewed: 08/31/2018 Elsevier Patient Education  2020 [employer].

## 2019-11-28 NOTE — Progress Notes (Addendum)
Subjective:     Patient ID: Cory James, male   DOB: 04/15/1981, 38 y.o.   MRN: [id number]  Cory James presents for Back Pain (x 2 weeks)  New onset of back pain for 14 days. This pain is located in the lower back with limited radiation into the Left anterior thigh. Pain score today in office is rated 4/10. At its worse the pain is a level 10/10. It has started to disturb sleep and limiting movement. It is relieved by standing and pressure on the muscle. It is aggravated by laying and bending and twist.  Modifying factors have included tylenol, robaxin, and naproxen. He did go to the emergency room back on November 29 right when this started.  That was who prescribed the Robaxin and naproxen.  He reports that not long after taking that he started having stomach cramping.  In the last 3 days he has had increased diarrhea and stomach discomfort.  Reports taking Tylenol makes his throat hurt.  He also is having a hard time taking ibuprofen as well.  Does have history of having irritable bowel.  There is no associated lower extremity numbness or weakness. There is no associated incontinence of stool or urine.  Today patient denies signs and symptoms of COVID 19 infection including fever, chills, cough, shortness of breath, and headache.  Past Medical, Surgical, Social History, Allergies, and Medications have been Reviewed. Past Medical History:  Diagnosis Date  . Allergy    seasonal  . Brain tumor (benign) ([place])   . Pancreatitis    Past Surgical History:  Procedure Laterality Date  . BRAIN SURGERY     Social History   Socioeconomic History  . Marital status: Married    Spouse name: [name]  . Number of children: 2  . Years of education: 22  . Highest education level: GED or equivalent  Occupational History  . Not on file  Social Needs  . Financial resource strain: Not hard at all  . Food insecurity    Worry: Never true    Inability: Never true  . Transportation  needs    Medical: No    Non-medical: No  Tobacco Use  . Smoking status: Current Every Day Smoker    Packs/day: 0.50    Years: 18.00    Pack years: 9.00    Types: E-cigarettes  . Smokeless tobacco: Never Used  Substance and Sexual Activity  . Alcohol use: Not Currently    Comment: rarely  . Drug use: Not Currently    Types: Marijuana    Comment: occassion use  . Sexual activity: Yes  Lifestyle  . Physical activity    Days per week: 0 days    Minutes per session: 0 min  . Stress: Very much  Relationships  . Social connections    Talks on phone: More than three times a week    Gets together: Twice a week    Attends religious service: Never    Active member of club or organization: No    Attends meetings of clubs or organizations: Never    Relationship status: Married  . Intimate partner violence    Fear of current or ex partner: No    Emotionally abused: No    Physically abused: No    Forced sexual activity: No  Other Topics Concern  . Not on file  Social History Narrative   Married for 10 years to [place]      Two Children:   2020  [name] 65 years old      [place] 1 years old      No pets     Outpatient Encounter Medications as of 11/28/2019  Medication Sig  . dicyclomine (BENTYL) 20 MG tablet Take 0.5 tablets (10 mg total) by mouth 3 (three) times daily as needed.  . lidocaine (LIDODERM) 5 % Place 1 patch onto the skin daily. Place 1 patch onto area of most pain daily. Remove & Discard patch within 12 hours  . loperamide (IMODIUM) 2 MG capsule Take by mouth as needed for diarrhea or loose stools.  . naproxen (NAPROSYN) 500 MG tablet Take 1 tablet (500 mg total) by mouth 2 (two) times daily.  . [DISCONTINUED] methocarbamol (ROBAXIN) 500 MG tablet Take 1 tablet (500 mg total) by mouth every 8 (eight) hours as needed for muscle spasms. (Patient not taking: Reported on 11/28/2019)   No facility-administered encounter medications on file as of 11/28/2019.    No Known  Allergies  Review of Systems  Constitutional: Negative for chills and fever.  HENT: Negative.   Eyes: Negative.   Respiratory: Negative.   Cardiovascular: Negative.   Gastrointestinal: Negative.   Endocrine: Negative.   Genitourinary: Negative.   Musculoskeletal: Positive for back pain.  Skin: Negative.   Allergic/Immunologic: Negative.   Neurological: Negative.   Hematological: Negative.   Psychiatric/Behavioral: Negative.   All other systems reviewed and are negative.      Objective:     BP 130/80   Pulse 83   Temp 97.6 F (36.4 C) (Oral)   Resp 15   Ht 5\' 10"  (1.778 m)   Wt 182 lb 1.9 oz (82.6 kg)   SpO2 99%   BMI 26.13 kg/m   Physical Exam Vitals signs and nursing note reviewed.  Constitutional:      Appearance: Normal appearance.  HENT:     Head: Normocephalic and atraumatic.     Right Ear: External ear normal.     Left Ear: External ear normal.     Nose: Nose normal.     Mouth/Throat:     Pharynx: Oropharynx is clear.  Eyes:     General:        Right eye: No discharge.        Left eye: No discharge.     Conjunctiva/sclera: Conjunctivae normal.  Neck:     Musculoskeletal: Normal range of motion and neck supple.  Cardiovascular:     Rate and Rhythm: Normal rate and regular rhythm.     Pulses: Normal pulses.     Heart sounds: Normal heart sounds.  Pulmonary:     Effort: Pulmonary effort is normal.     Breath sounds: Normal breath sounds.  Musculoskeletal:     Lumbar back: He exhibits decreased range of motion and spasm.  Skin:    General: Skin is warm.  Neurological:     General: No focal deficit present.     Mental Status: He is alert and oriented to person, place, and time.  Psychiatric:        Mood and Affect: Mood normal.        Behavior: Behavior normal.        Thought Content: Thought content normal.        Judgment: Judgment normal.        Assessment and Plan        1. Acute low back pain, unspecified back pain laterality,  unspecified whether sciatica present S&S consistent with acute bilateral low back pain unspecified  sciatica predominantly he is noticing that the pain is staying more on the left now.  Was seen in the emergency room back on November 29 for this.  Provided with lidocaine patch but could not afford to get this so he did not get it.  Robaxin and naproxen which she reports did not help him.  He reports that he thinks that the medicines made his stomach flareup.  Reported that in the last couple of days he did some stretching and that seems to have helped him greatly.  His pain is now 4 out of 10.  He is able to sit up without having to bend back over immediately. Provided with Flexeril and encouraged him to stop taking the naproxen and/or ibuprofen at this time.  Encouraged him to continue back exercises and stretches.  In addition to using a heating pad at night if needed.  Additionally advised for him to use Tums as his stomach is irritated.  - cyclobenzaprine (FLEXERIL) 5 MG tablet; Take 1 tablet (5 mg total) by mouth 2 (two) times daily as needed for muscle spasms.  Dispense: 60 tablet; Refill: 0  2. Need for immunization against influenza Patient was educated on the recommendation for flu vaccine. After obtaining informed consent, the vaccine was administered no adverse effects noted at time of administration. Patient provided with education on arm soreness and use of tylenol or ibuprofen (if safe) for this. Encourage to use the arm vaccine was given in to help reduce the soreness. Patient educated on the signs of a reaction to the vaccine and advised to contact the office should these occur.   - Flu Vaccine QUAD 36+ mos IM  3. Irritable bowel syndrome with diarrhea Referral back to gastroenterology, restart Bentyl.  Possible need for EGD as he complains of extreme discomfort possible inflammation in the colon area.  Hopefully Dr. Melony Overly or Associates can help figure out plan of care for him.  Appreciate  collaboration in his care please let PCP know if there is anything that we can do.  - dicyclomine (BENTYL) 20 MG tablet; Take 0.5 tablets (10 mg total) by mouth 3 (three) times daily as needed.  Dispense: 42 tablet; Refill: 0 - Ambulatory referral to Gastroenterology   Follow up: 4-6 weeks   Perlie Mayo, DNP, AGNP-BC [address] [place], [place] [id number] Office Hours: Mon-Thurs 8 am-5 pm; Fri 8 am-12 pm Office Phone:  [phone]  Office Fax: [phone]

## 2019-12-26 ENCOUNTER — Other Ambulatory Visit: Payer: Self-pay

## 2019-12-26 ENCOUNTER — Ambulatory Visit (INDEPENDENT_AMBULATORY_CARE_PROVIDER_SITE_OTHER): Payer: Self-pay | Admitting: Family Medicine

## 2019-12-26 ENCOUNTER — Encounter: Payer: Self-pay | Admitting: Family Medicine

## 2019-12-26 VITALS — BP 120/80 | HR 97 | Temp 99.0°F | Resp 15 | Ht 70.0 in | Wt 181.1 lb

## 2019-12-26 DIAGNOSIS — L709 Acne, unspecified: Secondary | ICD-10-CM | POA: Insufficient documentation

## 2019-12-26 DIAGNOSIS — K580 Irritable bowel syndrome with diarrhea: Secondary | ICD-10-CM

## 2019-12-26 DIAGNOSIS — M545 Low back pain, unspecified: Secondary | ICD-10-CM | POA: Insufficient documentation

## 2019-12-26 DIAGNOSIS — M546 Pain in thoracic spine: Secondary | ICD-10-CM

## 2019-12-26 HISTORY — DX: Acne, unspecified: L70.9

## 2019-12-26 HISTORY — DX: Pain in thoracic spine: M54.6

## 2019-12-26 MED ORDER — DICYCLOMINE HCL 20 MG PO TABS: 10.0000 mg | ORAL_TABLET | Freq: Three times a day (TID) | ORAL | 0 refills | Status: DC | PRN

## 2019-12-26 NOTE — Assessment & Plan Note (Signed)
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 Most likely related to work. Encouraged to wash face twice daily with gentle soap. He can also use a toner or acne rinse for it.  Reviewed side effects, risks and benefits of medication.   Patient acknowledged agreement and understanding of the plan.

## 2019-12-26 NOTE — Assessment & Plan Note (Signed)
Recently started 1-2 times related to eating. Someone reproducible in office. But reports it is not constant.  Encouraged to try the flexeril-as it seems muscular related.  Reviewed side effects, risks and benefits of medication.   Patient acknowledged agreement and understanding of the plan.

## 2019-12-26 NOTE — Progress Notes (Signed)
Subjective:  Patient ID: Cory James, male    DOB: August 23, 1981  Age: 39 y.o. MRN: [id number]  CC:  Chief Complaint  Patient presents with  . Back Pain    follow up pain is better      HPI  HPI  Here today for follow-up on back pain reports that it is much better in his lower back.  But he started to have some mid upper described in the thoracic area discomfort that starts in one spot that kind of spreads.  He reports that it happens predominantly at night.  Happens when he lays down.  Also he has been eating 1 or 2 times and after he swallowed he kind of felt the food hit that certain point and he can feel it then as well.  He denies having any changes in his eating or bowel habits.  Does have IBS and continues to have the diarrhea issues and has not been taking his Bentyl as he reports the pharmacy did not have it or give it to him.  He reports that he use the Flexeril for his upper back but has not tried it for his mid back pain.  Additionally he reports that he has some acne that he is unsure of what is going on or why it is happening but he does work around a lot of grease.  Would like to get back into the GI office he has not had time to follow-up with them does need to get treatment for his diarrhea as he continues to suffer from that.  Weight is stable at this time.   Today patient denies signs and symptoms of COVID 19 infection including fever, chills, cough, shortness of breath, and headache. Past Medical, Surgical, Social History, Allergies, and Medications have been Reviewed.   Past Medical History:  Diagnosis Date  . Allergy    seasonal  . Brain tumor (benign) ([place])   . Pancreatitis     Current Meds  Medication Sig  . cyclobenzaprine (FLEXERIL) 5 MG tablet Take 1 tablet (5 mg total) by mouth 2 (two) times daily as needed for muscle spasms.  Marland Kitchen dicyclomine (BENTYL) 20 MG tablet Take 0.5 tablets (10 mg total) by mouth 3 (three) times daily as needed.  . lidocaine  (LIDODERM) 5 % Place 1 patch onto the skin daily. Place 1 patch onto area of most pain daily. Remove & Discard patch within 12 hours  . loperamide (IMODIUM) 2 MG capsule Take by mouth as needed for diarrhea or loose stools.  . naproxen (NAPROSYN) 500 MG tablet Take 1 tablet (500 mg total) by mouth 2 (two) times daily.  . [DISCONTINUED] dicyclomine (BENTYL) 20 MG tablet Take 0.5 tablets (10 mg total) by mouth 3 (three) times daily as needed.    ROS:  Review of Systems  Constitutional: Negative.   HENT: Negative.   Eyes: Negative.   Respiratory: Negative.   Cardiovascular: Negative.   Gastrointestinal: Positive for diarrhea.  Genitourinary: Negative.   Musculoskeletal: Positive for back pain.       Mid upper back pain  Low back pain is better   Skin: Negative.   Neurological: Negative.   Endo/Heme/Allergies: Negative.   Psychiatric/Behavioral: Negative.   All other systems reviewed and are negative.    Objective:   Today's Vitals: BP 120/80   Pulse 97   Temp 99 F (37.2 C) (Oral)   Resp 15   Ht 5' 10"  (1.778 m)   Wt 181 lb 1.9  oz (82.2 kg)   SpO2 100%   BMI 25.99 kg/m  Vitals with BMI 12/26/2019 11/28/2019 11/18/2019  Height 5' 10"  5' 10"  5' 10"   Weight 181 lbs 2 oz 182 lbs 2 oz 175 lbs  BMI 25.99 91.63 84.66  Systolic 599 357 017  Diastolic 80 80 90  Pulse 97 83 75     Physical Exam Vitals and nursing note reviewed.  Constitutional:      Appearance: Normal appearance. He is well-developed and well-groomed.  HENT:     Head: Normocephalic and atraumatic.     Right Ear: External ear normal.     Left Ear: External ear normal.     Nose: Nose normal.     Mouth/Throat:     Mouth: Mucous membranes are moist.     Pharynx: Oropharynx is clear.  Eyes:     General:        Right eye: No discharge.        Left eye: No discharge.     Conjunctiva/sclera: Conjunctivae normal.  Cardiovascular:     Rate and Rhythm: Normal rate and regular rhythm.     Pulses: Normal  pulses.     Heart sounds: Normal heart sounds.  Pulmonary:     Effort: Pulmonary effort is normal.     Breath sounds: Normal breath sounds.  Abdominal:     General: Abdomen is flat. There is no distension.  Musculoskeletal:        General: Normal range of motion.     Cervical back: Normal range of motion and neck supple.  Skin:    General: Skin is warm.  Neurological:     General: No focal deficit present.     Mental Status: He is alert and oriented to person, place, and time.  Psychiatric:        Attention and Perception: Attention normal.        Mood and Affect: Mood normal.        Speech: Speech normal.        Behavior: Behavior normal. Behavior is cooperative.        Thought Content: Thought content normal.        Cognition and Memory: Cognition normal.        Judgment: Judgment normal.     Assessment   1. Irritable bowel syndrome with diarrhea   2. Acute low back pain, unspecified back pain laterality, unspecified whether sciatica present   3. Acute midline thoracic back pain   4. Adult acne     Tests ordered No orders of the defined types were placed in this encounter.    Plan: Please see assessment and plan per problem list above.   Meds ordered this encounter  Medications  . dicyclomine (BENTYL) 20 MG tablet    Sig: Take 0.5 tablets (10 mg total) by mouth 3 (three) times daily as needed.    Dispense:  42 tablet    Refill:  0    Order Specific Question:   Supervising Provider    Answer:   Fayrene Helper [[id number]]    Patient to follow-up in 3 months .  Perlie Mayo, NP

## 2019-12-26 NOTE — Patient Instructions (Addendum)
Happy New Year! May you have a year filled with hope, love, happiness and laughter.  I appreciate the opportunity to provide you with care for your health and wellness. Today we discussed: back pain, diarrhea and upper back pain  Follow up: 3 months   No labs or referrals today  GI office today for appt:   Pick up Bentyl once your insurance card is in.  Pick up probiotics for diarrhea, take daily for 30 days.  Please continue to practice social distancing to keep you, your family, and our community safe.  If you must go out, please wear a mask and practice good handwashing.  It was a pleasure to see you and I look forward to continuing to work together on your health and well-being. Please do not hesitate to call the office if you need care or have questions about your care.  Have a wonderful day and week. With Gratitude, Cherly Beach, DNP, AGNP-BC

## 2019-12-26 NOTE — Assessment & Plan Note (Signed)
He was Refered back to gastroenterology in start of Dec and was to restart Bentyl.   Reports he didn't restart medication, does not recall pharmacy having it. I have sent this back to pharmacy for him to get in addition to probiotics. Calling GI office today to help get him an appt. Possible need for EGD as he complains of extreme discomfort possible inflammation in the colon area.  Hopefully Dr. Melony Overly or Associates can help figure out plan of care for him.  Appreciate collaboration in his care please let PCP know if there is anything that we can do.

## 2019-12-26 NOTE — Assessment & Plan Note (Signed)
Improved

## 2020-02-05 ENCOUNTER — Encounter (INDEPENDENT_AMBULATORY_CARE_PROVIDER_SITE_OTHER): Payer: Self-pay | Admitting: Internal Medicine

## 2020-02-05 ENCOUNTER — Other Ambulatory Visit: Payer: Self-pay

## 2020-02-05 ENCOUNTER — Ambulatory Visit (INDEPENDENT_AMBULATORY_CARE_PROVIDER_SITE_OTHER): Payer: BC Managed Care – PPO | Admitting: Internal Medicine

## 2020-02-05 DIAGNOSIS — K529 Noninfective gastroenteritis and colitis, unspecified: Secondary | ICD-10-CM

## 2020-02-05 DIAGNOSIS — K580 Irritable bowel syndrome with diarrhea: Secondary | ICD-10-CM

## 2020-02-05 NOTE — Progress Notes (Signed)
Presenting complaint;  Follow-up for chronic diarrhea.  Database and subjective:  Patient is 39 year old male who was last seen in March 2020 for 26-monthhistory of diarrhea with 5-6 stools per day.  Bowel movements occurred within few minutes of his meals and also at nights.  He reported his bowel movements to be explosive.  No history of constipation.  He also reported passing small amount of blood intermittently.  He had not lost any weight. Past history was significant for pancreatitis which he suffered in July 2019 without a definite etiology.  Following bout with pancreatitis he change his eating habits and managed to lose 26 pounds by intermittent fasting.  His stool studies had been obtained by PCP and were pending at the time of his office visit.  I felt that he had IBS and hematochezia may be secondary to hemorrhoids in setting of diarrhea and explosive bowel movement. Patient was begun on dicyclomine 10 mg 2 or 3 times a day along with loperamide 2 mg once or twice a day if dicyclomine did not work.  Patient was not seen because of Covid pandemic. He did have stool studies completed in April 2020.  Stool culture was negative and stool O&P was also negative.  He says his diarrhea has gotten worse.  He is having 8-10 stools per day.  He works every day from 6 AM to 4 PM.  He says he eats very little while at work and eats large amount of food between 4 PM and 8 PM before he goes to bed.  Most of his bowel movements occur at night and sometimes he has a BM every hour.  He is still noticing small amount of blood every now and then and he generally sees 1 to 2 drops.  He has not experienced frank bleeding.  He has good appetite.  He has gained 14 pounds since his last visit of March 09, 2019.  He denies nausea vomiting fever or chills.  He does complain of feeling bloated.  He is gives a score of 5 6 and 7 to his stool on [place] stool chart.  He is also having accidents which usually occur at  night.  On few occasions he thought he was passing flatus but small amount of stool also skips.  He is not short of dicyclomine has helped but then he forgets to take it as recommended.  He is not having any side effects with dicyclomine. He has tried watching dairy products but he is not sure if it decreases stool frequency. Family history is negative for eye inflammatory bowel disease.  Current Medications: Outpatient Encounter Medications as of 02/05/2020  Medication Sig  . dicyclomine (BENTYL) 20 MG tablet Take 0.5 tablets (10 mg total) by mouth 3 (three) times daily as needed.  . Probiotic Product (PROBIOTIC PO) Take by mouth daily.  .Marland Kitchenloperamide (IMODIUM) 2 MG capsule Take by mouth as needed for diarrhea or loose stools.  . [DISCONTINUED] cyclobenzaprine (FLEXERIL) 5 MG tablet Take 1 tablet (5 mg total) by mouth 2 (two) times daily as needed for muscle spasms. (Patient not taking: Reported on 02/05/2020)  . [DISCONTINUED] lidocaine (LIDODERM) 5 % Place 1 patch onto the skin daily. Place 1 patch onto area of most pain daily. Remove & Discard patch within 12 hours (Patient not taking: Reported on 02/05/2020)  . [DISCONTINUED] naproxen (NAPROSYN) 500 MG tablet Take 1 tablet (500 mg total) by mouth 2 (two) times daily. (Patient not taking: Reported on 02/05/2020)   No  facility-administered encounter medications on file as of 02/05/2020.     Objective: Blood pressure 128/81, pulse 64, temperature 97.8 F (36.6 C), temperature source Temporal, height 5' 10"  (1.778 m), weight 183 lb 12.8 oz (83.4 kg). Patient is alert and in no acute distress. Patient is wearing a facial mask. Conjunctiva is pink. Sclera is nonicteric Oropharyngeal mucosa is normal. No neck masses or thyromegaly noted. Cardiac exam with regular rhythm normal S1 and S2. No murmur or gallop noted. Lungs are clear to auscultation. Abdomen is symmetrical.  Bowel sounds are normal.  On palpation abdomen is soft.  He has mild  tenderness in hypogastric region on deep palpation.  No organomegaly or masses. No LE edema or clubbing noted.  Assessment:  #1.  Chronic diarrhea felt to be due to IBS but he is not responding to therapy.  He is also having intermittent hematochezia which may be secondary to hemorrhoids.  Stool culture and O&P last year were negative.  He was noted to be mildly anemic on his blood work from February 2020 when his hemoglobin was 11.9. Given significant weight gain I do not believe we are dealing with small bowel diarrhea. Given this scenario need to worry about inflammatory bowel disease.  He would therefore would benefit from diagnostic colonoscopy.   Plan:  Patient advised to take 20 mg of dicyclomine 30 minutes before evening meal. Patient advised to cut back on the amount of food that he is eating every evening as it is making his diarrhea worse. Diagnostic colonoscopy to be scheduled in near future. Office visit in 3 months.

## 2020-02-05 NOTE — Patient Instructions (Signed)
Colonoscopy to be scheduled near future. Can try dicyclomine 20 mg 30 minutes before evening meal.

## 2020-02-06 ENCOUNTER — Other Ambulatory Visit (INDEPENDENT_AMBULATORY_CARE_PROVIDER_SITE_OTHER): Payer: Self-pay | Admitting: *Deleted

## 2020-02-06 ENCOUNTER — Encounter (INDEPENDENT_AMBULATORY_CARE_PROVIDER_SITE_OTHER): Payer: Self-pay | Admitting: *Deleted

## 2020-02-06 DIAGNOSIS — K529 Noninfective gastroenteritis and colitis, unspecified: Secondary | ICD-10-CM

## 2020-02-12 ENCOUNTER — Other Ambulatory Visit (INDEPENDENT_AMBULATORY_CARE_PROVIDER_SITE_OTHER): Payer: Self-pay | Admitting: *Deleted

## 2020-02-14 ENCOUNTER — Other Ambulatory Visit: Payer: Self-pay

## 2020-02-14 ENCOUNTER — Other Ambulatory Visit (INDEPENDENT_AMBULATORY_CARE_PROVIDER_SITE_OTHER): Payer: Self-pay | Admitting: *Deleted

## 2020-02-14 DIAGNOSIS — K580 Irritable bowel syndrome with diarrhea: Secondary | ICD-10-CM

## 2020-02-14 MED ORDER — DICYCLOMINE HCL 20 MG PO TABS: 10.0000 mg | ORAL_TABLET | Freq: Three times a day (TID) | ORAL | 1 refills | Status: DC

## 2020-02-15 ENCOUNTER — Encounter (HOSPITAL_COMMUNITY): Payer: Self-pay

## 2020-02-15 ENCOUNTER — Other Ambulatory Visit (HOSPITAL_COMMUNITY)
Admission: RE | Admit: 2020-02-15 | Discharge: 2020-02-15 | Disposition: A | Discharge status: Home / Self Care | Payer: 59 | Source: Ambulatory Visit | Attending: Internal Medicine | Admitting: Internal Medicine

## 2020-02-15 ENCOUNTER — Other Ambulatory Visit: Payer: Self-pay

## 2020-02-15 ENCOUNTER — Encounter (HOSPITAL_COMMUNITY)
Admission: RE | Admit: 2020-02-15 | Discharge: 2020-02-15 | Disposition: A | Discharge status: Home / Self Care | Payer: 59 | Source: Ambulatory Visit | Attending: Internal Medicine | Admitting: Internal Medicine

## 2020-02-15 DIAGNOSIS — Z20822 Contact with and (suspected) exposure to covid-19: Secondary | ICD-10-CM | POA: Diagnosis not present

## 2020-02-15 DIAGNOSIS — Z01812 Encounter for preprocedural laboratory examination: Secondary | ICD-10-CM | POA: Insufficient documentation

## 2020-02-15 LAB — SARS CORONAVIRUS 2 (TAT 6-24 HRS): SARS Coronavirus 2: NEGATIVE

## 2020-02-15 NOTE — Patient Instructions (Signed)
Cory James  02/15/2020     @PREFPERIOPPHARMACY @   Your procedure is scheduled on  02/18/2020   Report to [facility] at  [email or web address].  Call this number if you have problems the morning of surgery:  [phone]   Remember:  Follow the diet and prep instructions given to you by Dr Olevia Perches office.                   Take these medicines the morning of surgery with A SIP OF WATER None    Do not wear jewelry, make-up or nail polish.  Do not wear lotions, powders, or perfumes. Please wear deodorant and brush your teeth.  Do not shave 48 hours prior to surgery.  Men may shave face and neck.  Do not bring valuables to the hospital.  [facility] is not responsible for any belongings or valuables.  Contacts, dentures or bridgework may not be worn into surgery.  Leave your suitcase in the car.  After surgery it may be brought to your room.  For patients admitted to the hospital, discharge time will be determined by your treatment team.  Patients discharged the day of surgery will not be allowed to drive home.   Name and phone number of your driver:   family Special instructions:  DO NOT smoke the morning of your procedure.  Please read over the following fact sheets that you were given. Anesthesia Post-op Instructions and Care and Recovery After Surgery       Colonoscopy, Adult, Care After This sheet gives you information about how to care for yourself after your procedure. Your health care provider may also give you more specific instructions. If you have problems or questions, contact your health care provider. What can I expect after the procedure? After the procedure, it is common to have:  A small amount of blood in your stool for 24 hours after the procedure.  Some gas.  Mild cramping or bloating of your abdomen. Follow these instructions at home: Eating and drinking   Drink enough fluid to keep your urine pale yellow.  Follow instructions from your health  care provider about eating or drinking restrictions.  Resume your normal diet as instructed by your health care provider. Avoid heavy or fried foods that are hard to digest. Activity  Rest as told by your health care provider.  Avoid sitting for a long time without moving. Get up to take short walks every 1-2 hours. This is important to improve blood flow and breathing. Ask for help if you feel weak or unsteady.  Return to your normal activities as told by your health care provider. Ask your health care provider what activities are safe for you. Managing cramping and bloating   Try walking around when you have cramps or feel bloated.  Apply heat to your abdomen as told by your health care provider. Use the heat source that your health care provider recommends, such as a moist heat pack or a heating pad. ? Place a towel between your skin and the heat source. ? Leave the heat on for 20-30 minutes. ? Remove the heat if your skin turns bright red. This is especially important if you are unable to feel pain, heat, or cold. You may have a greater risk of getting burned. General instructions  For the first 24 hours after the procedure: ? Do not drive or use machinery. ? Do not sign important documents. ? Do not  drink alcohol. ? Do your regular daily activities at a slower pace than normal. ? Eat soft foods that are easy to digest.  Take over-the-counter and prescription medicines only as told by your health care provider.  Keep all follow-up visits as told by your health care provider. This is important. Contact a health care provider if:  You have blood in your stool 2-3 days after the procedure. Get help right away if you have:  More than a small spotting of blood in your stool.  Large blood clots in your stool.  Swelling of your abdomen.  Nausea or vomiting.  A fever.  Increasing pain in your abdomen that is not relieved with medicine. Summary  After the procedure, it is  common to have a small amount of blood in your stool. You may also have mild cramping and bloating of your abdomen.  For the first 24 hours after the procedure, do not drive or use machinery, sign important documents, or drink alcohol.  Get help right away if you have a lot of blood in your stool, nausea or vomiting, a fever, or increased pain in your abdomen. This information is not intended to replace advice given to you by your health care provider. Make sure you discuss any questions you have with your health care provider. Document Revised: 07/02/2019 Document Reviewed: 07/02/2019 Elsevier Patient Education  [place] After These instructions provide you with information about caring for yourself after your procedure. Your health care provider may also give you more specific instructions. Your treatment has been planned according to current medical practices, but problems sometimes occur. Call your health care provider if you have any problems or questions after your procedure. What can I expect after the procedure? After your procedure, you may:  Feel sleepy for several hours.  Feel clumsy and have poor balance for several hours.  Feel forgetful about what happened after the procedure.  Have poor judgment for several hours.  Feel nauseous or vomit.  Have a sore throat if you had a breathing tube during the procedure. Follow these instructions at home: For at least 24 hours after the procedure:      Have a responsible adult stay with you. It is important to have someone help care for you until you are awake and alert.  Rest as needed.  Do not: ? Participate in activities in which you could fall or become injured. ? Drive. ? Use heavy machinery. ? Drink alcohol. ? Take sleeping pills or medicines that cause drowsiness. ? Make important decisions or sign legal documents. ? Take care of children on your own. Eating and  drinking  Follow the diet that is recommended by your health care provider.  If you vomit, drink water, juice, or soup when you can drink without vomiting.  Make sure you have little or no nausea before eating solid foods. General instructions  Take over-the-counter and prescription medicines only as told by your health care provider.  If you have sleep apnea, surgery and certain medicines can increase your risk for breathing problems. Follow instructions from your health care provider about wearing your sleep device: ? Anytime you are sleeping, including during daytime naps. ? While taking prescription pain medicines, sleeping medicines, or medicines that make you drowsy.  If you smoke, do not smoke without supervision.  Keep all follow-up visits as told by your health care provider. This is important. Contact a health care provider if:  You keep feeling nauseous  or you keep vomiting.  You feel light-headed.  You develop a rash.  You have a fever. Get help right away if:  You have trouble breathing. Summary  For several hours after your procedure, you may feel sleepy and have poor judgment.  Have a responsible adult stay with you for at least 24 hours or until you are awake and alert. This information is not intended to replace advice given to you by your health care provider. Make sure you discuss any questions you have with your health care provider. Document Revised: 03/06/2018 Document Reviewed: 03/28/2016 Elsevier Patient Education  [place].

## 2020-02-18 ENCOUNTER — Encounter (HOSPITAL_COMMUNITY)
Admission: RE | Disposition: A | Discharge status: Home / Self Care | Payer: Self-pay | Source: Home / Self Care | Attending: Internal Medicine

## 2020-02-18 ENCOUNTER — Ambulatory Visit (HOSPITAL_COMMUNITY)
Admission: RE | Admit: 2020-02-18 | Discharge: 2020-02-18 | Disposition: A | Discharge status: Home / Self Care | Payer: 59 | Attending: Internal Medicine | Admitting: Internal Medicine

## 2020-02-18 ENCOUNTER — Encounter (HOSPITAL_COMMUNITY): Payer: Self-pay | Admitting: Internal Medicine

## 2020-02-18 ENCOUNTER — Ambulatory Visit (HOSPITAL_COMMUNITY): Payer: 59 | Admitting: Anesthesiology

## 2020-02-18 ENCOUNTER — Ambulatory Visit (HOSPITAL_COMMUNITY): Admission: RE | Admit: 2020-02-18 | Payer: 59 | Source: Ambulatory Visit | Admitting: Internal Medicine

## 2020-02-18 DIAGNOSIS — K5100 Ulcerative (chronic) pancolitis without complications: Secondary | ICD-10-CM | POA: Diagnosis not present

## 2020-02-18 DIAGNOSIS — K51011 Ulcerative (chronic) pancolitis with rectal bleeding: Secondary | ICD-10-CM

## 2020-02-18 DIAGNOSIS — F17290 Nicotine dependence, other tobacco product, uncomplicated: Secondary | ICD-10-CM | POA: Insufficient documentation

## 2020-02-18 DIAGNOSIS — K625 Hemorrhage of anus and rectum: Secondary | ICD-10-CM | POA: Diagnosis not present

## 2020-02-18 DIAGNOSIS — K529 Noninfective gastroenteritis and colitis, unspecified: Secondary | ICD-10-CM

## 2020-02-18 HISTORY — PX: COLONOSCOPY WITH PROPOFOL: SHX5780

## 2020-02-18 LAB — CBC WITH DIFFERENTIAL/PLATELET
Abs Immature Granulocytes: 0.02 10*3/uL (ref 0.00–0.07)
Basophils Absolute: 0.1 10*3/uL (ref 0.0–0.1)
Basophils Relative: 1 %
Eosinophils Absolute: 0.4 10*3/uL (ref 0.0–0.5)
Eosinophils Relative: 5 %
HCT: 43.1 % (ref 39.0–52.0)
Hemoglobin: 13.3 g/dL (ref 13.0–17.0)
Immature Granulocytes: 0 %
Lymphocytes Relative: 29 %
Lymphs Abs: 2.7 10*3/uL (ref 0.7–4.0)
MCH: 24.8 pg — ABNORMAL LOW (ref 26.0–34.0)
MCHC: 30.9 g/dL (ref 30.0–36.0)
MCV: 80.3 fL (ref 80.0–100.0)
Monocytes Absolute: 0.7 10*3/uL (ref 0.1–1.0)
Monocytes Relative: 7 %
Neutro Abs: 5.3 10*3/uL (ref 1.7–7.7)
Neutrophils Relative %: 58 %
Platelets: 284 10*3/uL (ref 150–400)
RBC: 5.37 MIL/uL (ref 4.22–5.81)
RDW: 14.6 % (ref 11.5–15.5)
WBC: 9.1 10*3/uL (ref 4.0–10.5)
nRBC: 0 % (ref 0.0–0.2)

## 2020-02-18 LAB — COMPREHENSIVE METABOLIC PANEL
ALT: 22 U/L (ref 0–44)
AST: 18 U/L (ref 15–41)
Albumin: 3.8 g/dL (ref 3.5–5.0)
Alkaline Phosphatase: 86 U/L (ref 38–126)
Anion gap: 7 (ref 5–15)
BUN: 12 mg/dL (ref 6–20)
CO2: 28 mmol/L (ref 22–32)
Calcium: 9.4 mg/dL (ref 8.9–10.3)
Chloride: 105 mmol/L (ref 98–111)
Creatinine, Ser: 1.12 mg/dL (ref 0.61–1.24)
GFR calc Af Amer: >60 mL/min (ref >60)
GFR calc non Af Amer: >60 mL/min (ref >60)
Glucose, Bld: 86 mg/dL (ref 70–99)
Potassium: 5 mmol/L (ref 3.5–5.1)
Sodium: 140 mmol/L (ref 135–145)
Total Bilirubin: 1.1 mg/dL (ref 0.3–1.2)
Total Protein: 8.2 g/dL — ABNORMAL HIGH (ref 6.5–8.1)

## 2020-02-18 LAB — C-REACTIVE PROTEIN: CRP: 0.6 mg/dL (ref <1.0)

## 2020-02-18 SURGERY — COLONOSCOPY WITH PROPOFOL
Anesthesia: Moderate Sedation

## 2020-02-18 SURGERY — COLONOSCOPY WITH PROPOFOL
Anesthesia: General

## 2020-02-18 MED ORDER — KETAMINE HCL 50 MG/5ML IJ SOSY
PREFILLED_SYRINGE | INTRAMUSCULAR | Status: AC
  Filled 2020-02-18: qty 5

## 2020-02-18 MED ORDER — LACTATED RINGERS IV SOLN: Freq: Once | INTRAVENOUS | Status: DC

## 2020-02-18 MED ORDER — GLYCOPYRROLATE 0.2 MG/ML IJ SOLN
INTRAMUSCULAR | Status: DC | PRN
  Administered 2020-02-18: .1 mg via INTRAVENOUS

## 2020-02-18 MED ORDER — LACTATED RINGERS IV SOLN: INTRAVENOUS | Status: DC | PRN

## 2020-02-18 MED ORDER — KETAMINE HCL 10 MG/ML IJ SOLN
INTRAMUSCULAR | Status: DC | PRN
  Administered 2020-02-18: 30 mg via INTRAVENOUS

## 2020-02-18 MED ORDER — LIDOCAINE HCL (CARDIAC) PF 100 MG/5ML IV SOSY
PREFILLED_SYRINGE | INTRAVENOUS | Status: DC | PRN
  Administered 2020-02-18: 30 mg via INTRAVENOUS

## 2020-02-18 MED ORDER — PROPOFOL 10 MG/ML IV BOLUS
INTRAVENOUS | Status: DC | PRN
  Administered 2020-02-18: 20 mg via INTRAVENOUS

## 2020-02-18 MED ORDER — CHLORHEXIDINE GLUCONATE CLOTH 2 % EX PADS: 6.0000 | MEDICATED_PAD | Freq: Once | CUTANEOUS | Status: DC

## 2020-02-18 MED ORDER — PROPOFOL 10 MG/ML IV BOLUS
INTRAVENOUS | Status: AC
  Filled 2020-02-18: qty 20

## 2020-02-18 MED ORDER — PROPOFOL 500 MG/50ML IV EMUL
INTRAVENOUS | Status: DC | PRN
  Administered 2020-02-18: 125 ug/kg/min via INTRAVENOUS

## 2020-02-18 NOTE — H&P (Signed)
Cory James is an 39 y.o. male.   Chief Complaint: Patient is here for colonoscopy. HPI: Patient is 40 year old male with over a year history of diarrhea who also has been noticing blood with bowel movements lately.  He is having anywhere from 8-10 stools per day.  However he has not lost any weight.  He had stool studies last year and these were negative for ova and parasites and enteric pathogens.  He has not responded to antispasmodic although he may not have been taking medication as prescribed. Family history is negative for inflammatory bowel disease or celiac disease. Covid test is negative.  Past Medical History:  Diagnosis Date  . Allergy    seasonal  . Brain tumor (benign) ([place])   . Pancreatitis     Past Surgical History:  Procedure Laterality Date  . BRAIN SURGERY      Family History  Problem Relation Age of Onset  . Hypertension Mother   . Diabetes Mother   . Healthy Father   . Healthy Sister    Social History:  reports that he has been smoking e-cigarettes. He has a 9.00 pack-year smoking history. He has never used smokeless tobacco. He reports previous alcohol use. He reports previous drug use. Drug: Marijuana.  Allergies: No Known Allergies  Medications Prior to Admission  Medication Sig Dispense Refill  . Aspirin-Salicylamide-Caffeine (BC FAST PAIN RELIEF) 650-195-33.3 MG PACK Take 1 packet by mouth daily as needed (headaches/pain.).    Marland Kitchen dicyclomine (BENTYL) 20 MG tablet Take 0.5 tablets (10 mg total) by mouth 3 (three) times daily before meals. 90 tablet 1  . LACTOBACILLUS PROBIOTIC PO Take 1 capsule by mouth daily.      No results found for this or any previous visit (from the past 48 hour(s)). No results found.  Review of Systems  Blood pressure 121/86, pulse 73, temperature 98 F (36.7 C), temperature source Oral, resp. rate 18, SpO2 99 %. Physical Exam  Constitutional: He appears well-developed and well-nourished.  HENT:  Mouth/Throat:  Oropharynx is clear and moist.  Eyes: Conjunctivae are normal. No scleral icterus.  Neck: No thyromegaly present.  Cardiovascular: Normal rate, regular rhythm and normal heart sounds.  No murmur heard. Respiratory: Effort normal and breath sounds normal.  GI: Soft. He exhibits no distension and no mass. There is no abdominal tenderness.  Musculoskeletal:        General: No edema.  Lymphadenopathy:    He has no cervical adenopathy.  Neurological: He is alert.  Skin: Skin is warm and dry.     Assessment/Plan Chronic diarrhea and hematochezia. Diagnostic colonoscopy.  Hildred Laser, MD 02/18/2020, 7:20 AM

## 2020-02-18 NOTE — Anesthesia Preprocedure Evaluation (Signed)
Anesthesia Evaluation  Patient identified by MRN, date of birth, ID band Patient awake    Reviewed: Allergy & Precautions, NPO status , Patient's Chart, lab work & pertinent test results  Airway Mallampati: III  TM Distance: >3 FB Neck ROM: Full    Dental no notable dental hx. (+) Teeth Intact   Pulmonary Current Smoker,    breath sounds clear to auscultation       Cardiovascular Exercise Tolerance: Good  Rhythm:Regular Rate:Normal     Neuro/Psych    GI/Hepatic   Endo/Other    Renal/GU      Musculoskeletal   Abdominal   Peds  Hematology   Anesthesia Other Findings   Reproductive/Obstetrics                             Anesthesia Physical Anesthesia Plan  ASA: II  Anesthesia Plan: General   Post-op Pain Management:    Induction: Intravenous  PONV Risk Score and Plan: 0 and TIVA  Airway Management Planned: Natural Airway and Nasal Cannula  Additional Equipment:   Intra-op Plan:   Post-operative Plan:   Informed Consent: I have reviewed the patients History and Physical, chart, labs and discussed the procedure including the risks, benefits and alternatives for the proposed anesthesia with the patient or authorized representative who has indicated his/her understanding and acceptance.     Dental advisory given  Plan Discussed with: CRNA  Anesthesia Plan Comments:         Anesthesia Quick Evaluation

## 2020-02-18 NOTE — Transfer of Care (Signed)
Immediate Anesthesia Transfer of Care Note  Patient: Mert Dietrick  Procedure(s) Performed: COLONOSCOPY WITH PROPOFOL (N/A )  Patient Location: PACU  Anesthesia Type:General  Level of Consciousness: awake  Airway & Oxygen Therapy: Patient Spontanous Breathing  Post-op Assessment: Report given to RN and Post -op Vital signs reviewed and stable  Post vital signs: Reviewed and stable  Last Vitals:  Vitals Value Taken Time  BP 99/61 02/18/20 0804  Temp    Pulse 63 02/18/20 0806  Resp 17 02/18/20 0806  SpO2 100 % 02/18/20 0806  Vitals shown include unvalidated device data.  Last Pain:  Vitals:   02/18/20 0703  TempSrc: Oral  PainSc: 0-No pain      Patients Stated Pain Goal: 5 (12/21/70 5366)  Complications: No apparent anesthesia complications

## 2020-02-18 NOTE — Discharge Instructions (Signed)
Colonoscopy, Adult, Care After This sheet gives you information about how to care for yourself after your procedure. Your health care provider may also give you more specific instructions. If you have problems or questions, contact your health care provider. What can I expect after the procedure? After the procedure, it is common to have:  A small amount of blood in your stool for 24 hours after the procedure.  Some gas.  Mild cramping or bloating of your abdomen. Follow these instructions at home: Eating and drinking   Drink enough fluid to keep your urine pale yellow.  Follow instructions from your health care provider about eating or drinking restrictions.  Resume your normal diet as instructed by your health care provider. Avoid heavy or fried foods that are hard to digest. Activity  Rest as told by your health care provider.  Avoid sitting for a long time without moving. Get up to take short walks every 1-2 hours. This is important to improve blood flow and breathing. Ask for help if you feel weak or unsteady.  Return to your normal activities as told by your health care provider. Ask your health care provider what activities are safe for you. Managing cramping and bloating   Try walking around when you have cramps or feel bloated.  Apply heat to your abdomen as told by your health care provider. Use the heat source that your health care provider recommends, such as a moist heat pack or a heating pad. ? Place a towel between your skin and the heat source. ? Leave the heat on for 20-30 minutes. ? Remove the heat if your skin turns bright red. This is especially important if you are unable to feel pain, heat, or cold. You may have a greater risk of getting burned. General instructions  For the first 24 hours after the procedure: ? Do not drive or use machinery. ? Do not sign important documents. ? Do not drink alcohol. ? Do your regular daily activities at a slower pace  than normal. ? Eat soft foods that are easy to digest.  Take over-the-counter and prescription medicines only as told by your health care provider.  Keep all follow-up visits as told by your health care provider. This is important. Contact a health care provider if:  You have blood in your stool 2-3 days after the procedure. Get help right away if you have:  More than a small spotting of blood in your stool.  Large blood clots in your stool.  Swelling of your abdomen.  Nausea or vomiting.  A fever.  Increasing pain in your abdomen that is not relieved with medicine. Summary  After the procedure, it is common to have a small amount of blood in your stool. You may also have mild cramping and bloating of your abdomen.  For the first 24 hours after the procedure, do not drive or use machinery, sign important documents, or drink alcohol.  Get help right away if you have a lot of blood in your stool, nausea or vomiting, a fever, or increased pain in your abdomen. This information is not intended to replace advice given to you by your health care provider. Make sure you discuss any questions you have with your health care provider. Document Revised: 07/02/2019 Document Reviewed: 07/02/2019 Elsevier Patient Education  [place].  No aspirin or NSAIDs. Can take Tylenol up to 2 g a day as needed for headache or musculoskeletal pain. Resume usual medications as before. Resume low residue  diet. No driving for 24 hours. Patient will call with results of biopsy blood in stool test.

## 2020-02-18 NOTE — Anesthesia Postprocedure Evaluation (Signed)
Anesthesia Post Note  Patient: Cory James  Procedure(s) Performed: COLONOSCOPY WITH PROPOFOL (N/A )  Patient location during evaluation: PACU Anesthesia Type: General Level of consciousness: awake and alert Pain management: pain level controlled Vital Signs Assessment: post-procedure vital signs reviewed and stable Respiratory status: spontaneous breathing, nonlabored ventilation and respiratory function stable Cardiovascular status: stable Postop Assessment: no apparent nausea or vomiting Anesthetic complications: no     Last Vitals:  Vitals:   02/18/20 0815 02/18/20 0831  BP: 113/83 136/81  Pulse: 64 69  Resp: 17 18  Temp:    SpO2: 100% 99%    Last Pain:  Vitals:   02/18/20 0831  TempSrc:   PainSc: 0-No pain                 Nieshia Larmon Hristova

## 2020-02-19 ENCOUNTER — Other Ambulatory Visit (INDEPENDENT_AMBULATORY_CARE_PROVIDER_SITE_OTHER): Payer: Self-pay | Admitting: Internal Medicine

## 2020-02-19 LAB — SURGICAL PATHOLOGY

## 2020-02-19 MED ORDER — PREDNISONE 10 MG PO TABS: 30.0000 mg | ORAL_TABLET | Freq: Every day | ORAL | 0 refills | Status: DC

## 2020-02-19 MED ORDER — MESALAMINE 1.2 G PO TBEC: 2.4000 g | DELAYED_RELEASE_TABLET | Freq: Two times a day (BID) | ORAL | 5 refills | Status: DC

## 2020-02-19 NOTE — Op Note (Signed)
[facility] Patient Name: Cory James Procedure Date: 02/18/2020 7:18 AM MRN: [id number] Date of Birth: Apr 08, 1981 Attending MD: Hildred Laser , MD CSN: [id number] Age: 39 Admit Type: Outpatient Procedure:                Colonoscopy Indications:              Chronic diarrhea Providers:                Hildred Laser, MD, Janeece Riggers, RN, Randa Spike,                            Technician Referring MD:             Cherly Beach, FNP Medicines:                Propofol per Anesthesia Complications:            No immediate complications. Estimated blood loss:                            None. Estimated Blood Loss:     Estimated blood loss was minimal. Procedure:                Pre-Anesthesia Assessment:                           - Prior to the procedure, a History and Physical                            was performed, and patient medications and                            allergies were reviewed. The patient's tolerance of                            previous anesthesia was also reviewed. The risks                            and benefits of the procedure and the sedation                            options and risks were discussed with the patient.                            All questions were answered, and informed consent                            was obtained. Prior Anticoagulants: The patient has                            taken no previous anticoagulant or antiplatelet                            agents. ASA Grade Assessment: II - A patient with                            mild systemic  disease. After reviewing the risks                            and benefits, the patient was deemed in                            satisfactory condition to undergo the procedure.                           After obtaining informed consent, the colonoscope                            was passed under direct vision. Throughout the                            procedure, the patient's blood pressure,  pulse, and                            oxygen saturations were monitored continuously. The                            [id number]-[id number] ([id number]) scope was introduced through                            the anus and advanced to the the terminal ileum,                            with identification of the appendiceal orifice and                            IC valve. The colonoscopy was performed without                            difficulty. The patient tolerated the procedure                            well. The quality of the bowel preparation was                            good. The terminal ileum, ileocecal valve,                            appendiceal orifice, and rectum were photographed. Scope In: 7:36:55 AM Scope Out: 7:53:12 AM Scope Withdrawal Time: 0 hours 14 minutes 10 seconds  Total Procedure Duration: 0 hours 16 minutes 17 seconds  Findings:      The perianal and digital rectal examinations were normal.      The terminal ileum appeared normal.      Inflammation was found in the colon. This was graded as Mayo Score 2       (moderate, with marked erythema, absent vascular pattern, friability,       erosions), and when compared to the previous examination, the findings       are new. Biopsies were taken with a cold forceps for histology. The  pathology specimen from right colon was placed into Bottle Number 1. The       pathology specimen from left colon was placed into Bottle Number 2. The       pathology specimen from rectum was placed into Bottle Number 3. Impression:               - The examined portion of the ileum was normal.                           - Moderately active (Mayo Score 2) pancolitis                            ulcerative colitis, new since the last examination.                            Biopsied.                           - Stool specimen sent to lab for GI Pathogens, Moderate Sedation:      Per Anesthesia Care Recommendation:           - Patient has a contact  number available for                            emergencies. The signs and symptoms of potential                            delayed complications were discussed with the                            patient. Return to normal activities tomorrow.                            Written discharge instructions were provided to the                            patient.                           - Low fiber diet today.                           - Continue present medications.                           - No aspirin, ibuprofen, naproxen, or other                            non-steroidal anti-inflammatory drugs.                           - Await pathology results.                           - CBC with diff, CRP and CMET.                           -  No recommendation at this time regarding repeat                            colonoscopy at this time. Procedure Code(s):        --- Professional ---                           [phone], Colonoscopy, flexible; with biopsy, single                            or multiple Diagnosis Code(s):        --- Professional ---                           K51.00, Ulcerative (chronic) pancolitis without                            complications                           K52.9, Noninfective gastroenteritis and colitis,                            unspecified CPT copyright 2019 American Medical Association. All rights reserved. The codes documented in this report are preliminary and upon coder review may  be revised to meet current compliance requirements. Hildred Laser, MD Hildred Laser, MD 02/18/2020 8:09:57 AM This report has been signed electronically. Number of Addenda: 0

## 2020-02-22 LAB — GI PATHOGEN PANEL BY PCR, STOOL

## 2020-03-24 ENCOUNTER — Ambulatory Visit (INDEPENDENT_AMBULATORY_CARE_PROVIDER_SITE_OTHER): Payer: 59 | Admitting: Internal Medicine

## 2020-03-25 ENCOUNTER — Encounter: Payer: Self-pay | Admitting: Family Medicine

## 2020-03-25 ENCOUNTER — Ambulatory Visit (INDEPENDENT_AMBULATORY_CARE_PROVIDER_SITE_OTHER): Payer: 59 | Admitting: Family Medicine

## 2020-03-25 ENCOUNTER — Other Ambulatory Visit: Payer: Self-pay

## 2020-03-25 VITALS — BP 136/86 | HR 83 | Temp 97.1°F | Resp 16 | Ht 70.0 in | Wt 187.0 lb

## 2020-03-25 DIAGNOSIS — K529 Noninfective gastroenteritis and colitis, unspecified: Secondary | ICD-10-CM

## 2020-03-25 DIAGNOSIS — Z87898 Personal history of other specified conditions: Secondary | ICD-10-CM | POA: Diagnosis not present

## 2020-03-25 DIAGNOSIS — R200 Anesthesia of skin: Secondary | ICD-10-CM | POA: Diagnosis not present

## 2020-03-25 DIAGNOSIS — Z83438 Family history of other disorder of lipoprotein metabolism and other lipidemia: Secondary | ICD-10-CM

## 2020-03-25 DIAGNOSIS — R7301 Impaired fasting glucose: Secondary | ICD-10-CM

## 2020-03-25 NOTE — Assessment & Plan Note (Signed)
Referral to neurology for follow-up on brain tumor removal.  Needs to get updated MRI.  Also having some signs of ongoing numbness and some pain starting.  Appreciate collaboration in his care please let PCP know if there is anything we can do on our end.

## 2020-03-25 NOTE — Assessment & Plan Note (Addendum)
Referral to neurology for follow-up on brain tumor removal.  Needs to get updated MRI.  Also having some signs of ongoing numbness and some pain starting.  Appreciate collaboration in his care please let PCP know if there is anything we can do on our end.

## 2020-03-25 NOTE — Patient Instructions (Addendum)
I appreciate the opportunity to provide you with care for your health and wellness. Today we discussed: overall health   Follow up: 1 year annual   Labs -nothing to eat 8-12 hours before nothing to eat  Enjoy the weather and playing tennis!  Please continue to practice social distancing to keep you, your family, and our community safe.  If you must go out, please wear a mask and practice good handwashing.  It was a pleasure to see you and I look forward to continuing to work together on your health and well-being. Please do not hesitate to call the office if you need care or have questions about your care.  Have a wonderful day and week. With Gratitude, Cherly Beach, DNP, AGNP-BC

## 2020-03-25 NOTE — Progress Notes (Signed)
Subjective:  Patient ID: Armani Brar, male    DOB: 11/03/1981  Age: 39 y.o. MRN: [id number]  CC:  Chief Complaint  Patient presents with  . Follow-up      HPI  HPI   Mr. Jarmon is a 39 year old male patient of mine.  Who presents today for follow-up and overall care.  Reports that his diarrhea is better under control.  Following Dr. Olevia Perches suggestions and taking his medications without any issue.  He has been able to put on about 20 pounds in the last year.  Secondary to this.  Reports he started to play tennis more and feels better overall.  He has follow-up with GI in June and from there he will just follow-up as needed.  Denies having any other complaints today to discuss in the office.  Outside of wanting a referral to neurology as he wants to follow-up from his brain tumor procedure that he had 2018.  Reports he still has numbness in the back of his head and into the parietal/temporal side, right side only.  Reports he has some discomfort and pain sometimes at night but then this subsides.  Was supposed to have MRI follow-up in case this mass was to grow back.  It was benign in nature.  He reports he has his records and is open to referral anywhere that his insurance accepts.   Today patient denies signs and symptoms of COVID 19 infection including fever, chills, cough, shortness of breath, and headache. Past Medical, Surgical, Social History, Allergies, and Medications have been Reviewed.   Past Medical History:  Diagnosis Date  . Acute midline thoracic back pain 12/26/2019  . Adult acne 12/26/2019  . Allergy    seasonal  . Brain tumor (benign) ([place])   . Pancreatitis     Current Meds  Medication Sig  . Aspirin-Salicylamide-Caffeine (BC FAST PAIN RELIEF) 650-195-33.3 MG PACK Take 1 packet by mouth daily as needed (headaches/pain.).  Marland Kitchen dicyclomine (BENTYL) 20 MG tablet Take 0.5 tablets (10 mg total) by mouth 3 (three) times daily before meals.  Marland Kitchen LACTOBACILLUS  PROBIOTIC PO Take 1 capsule by mouth daily.  . mesalamine (LIALDA) 1.2 g EC tablet Take 2 tablets (2.4 g total) by mouth 2 (two) times daily before a meal.    ROS:  Review of Systems  Constitutional: Negative.   HENT: Negative.   Eyes: Negative.   Respiratory: Negative.   Cardiovascular: Negative.   Gastrointestinal: Negative.   Genitourinary: Negative.   Musculoskeletal: Negative.   Skin: Negative.   Neurological: Negative.        See HPI  Endo/Heme/Allergies: Negative.   Psychiatric/Behavioral: Negative.   All other systems reviewed and are negative.   Objective:   Today's Vitals: BP 136/86   Pulse 83   Temp (!) 97.1 F (36.2 C) (Temporal)   Resp 16   Ht 5' 10"  (1.778 m)   Wt 187 lb (84.8 kg)   SpO2 98%   BMI 26.83 kg/m  Vitals with BMI 03/25/2020 02/18/2020 02/18/2020  Height 5' 10"  - -  Weight 187 lbs - -  BMI 00.86 - -  Systolic 761 950 932  Diastolic 86 81 83  Pulse 83 69 64     Physical Exam Vitals and nursing note reviewed.  Constitutional:      Appearance: Normal appearance. He is well-developed, well-groomed and normal weight.  HENT:     Head: Normocephalic and atraumatic.     Right Ear: External ear normal.  Left Ear: External ear normal.     Mouth/Throat:     Comments: Mask in place  Eyes:     General:        Right eye: No discharge.        Left eye: No discharge.     Conjunctiva/sclera: Conjunctivae normal.  Cardiovascular:     Rate and Rhythm: Normal rate and regular rhythm.     Pulses: Normal pulses.     Heart sounds: Normal heart sounds.  Pulmonary:     Effort: Pulmonary effort is normal.     Breath sounds: Normal breath sounds.  Musculoskeletal:        General: Normal range of motion.     Cervical back: Normal range of motion and neck supple.  Skin:    General: Skin is warm.  Neurological:     General: No focal deficit present.     Mental Status: He is alert and oriented to person, place, and time.  Psychiatric:        Attention  and Perception: Attention normal.        Mood and Affect: Mood normal.        Speech: Speech normal.        Behavior: Behavior normal. Behavior is cooperative.        Thought Content: Thought content normal.        Cognition and Memory: Cognition normal.        Judgment: Judgment normal.     Assessment   1. Chronic diarrhea   2. H/O brain tumor   3. Numbness   4. Family history of hyperlipidemia   5. Impaired fasting glucose     Tests ordered Orders Placed This Encounter  Procedures  . Hemoglobin A1c  . COMPLETE METABOLIC PANEL WITH GFR  . Lipid panel  . Ambulatory referral to Neurology     Plan: Please see assessment and plan per problem list above.   No orders of the defined types were placed in this encounter.   Patient to follow-up in 1 year  Perlie Mayo, NP

## 2020-06-10 ENCOUNTER — Ambulatory Visit (INDEPENDENT_AMBULATORY_CARE_PROVIDER_SITE_OTHER): Payer: 59 | Admitting: Internal Medicine

## 2020-06-10 ENCOUNTER — Encounter (INDEPENDENT_AMBULATORY_CARE_PROVIDER_SITE_OTHER): Payer: Self-pay | Admitting: Internal Medicine

## 2020-06-10 ENCOUNTER — Other Ambulatory Visit: Payer: Self-pay

## 2020-06-10 DIAGNOSIS — K51019 Ulcerative (chronic) pancolitis with unspecified complications: Secondary | ICD-10-CM

## 2020-06-10 DIAGNOSIS — K5190 Ulcerative colitis, unspecified, without complications: Secondary | ICD-10-CM | POA: Insufficient documentation

## 2020-06-10 MED ORDER — PREDNISONE 10 MG PO TABS: 30.0000 mg | ORAL_TABLET | Freq: Every day | ORAL | 0 refills | Status: DC

## 2020-06-10 MED ORDER — MESALAMINE 800 MG PO TBEC: 1600.0000 mg | DELAYED_RELEASE_TABLET | Freq: Two times a day (BID) | ORAL | 11 refills | Status: DC

## 2020-06-10 NOTE — Progress Notes (Signed)
Presenting complaint;  Follow-up for ulcerative colitis.  Database and subjective:  Patient is 39 year old male who was evaluated earlier this year for 1 year history of diarrhea and hematochezia.  He was having anywhere from 8-10 stools per day but has not lost any weight.  Stool studies were negative.  He underwent colonoscopy on 02/18/2020.  He had pan ulcerative colitis.  Terminal ileum was normal.  He was treated with prednisone and oral mesalamine.  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 He states he did great until about 2 months ago.  While he was on prednisone he began to have soft and formed stools and he stopped having abdominal pain and rectal bleeding.  His diarrhea started back about 2 months ago and he is back to his baseline.  He states he is already had 6 or 7 stools today.  For stool it occurs early in the morning.  He states he is having an average of 10 stools per day.  He is seeing some blood.  He is not having fever chills nausea or vomiting.  He is having abdominal cramping.  He is not having any side effects with Lialda. He works 7 days a week.  He remains under a lot of stress. He has not yet received Covid vaccine.  Current Medications: Outpatient Encounter Medications as of 06/10/2020  Medication Sig  . Aspirin-Salicylamide-Caffeine (BC FAST PAIN RELIEF) 650-195-33.3 MG PACK Take 1 packet by mouth daily as needed (headaches/pain.).  Marland Kitchen mesalamine (LIALDA) 1.2 g EC tablet Take 2 tablets (2.4 g total) by mouth 2 (two) times daily before a meal.  . dicyclomine (BENTYL) 20 MG tablet Take 0.5 tablets (10 mg total) by mouth 3 (three) times daily before meals. (Patient not taking: Reported on 06/10/2020)  . LACTOBACILLUS PROBIOTIC PO Take 1 capsule by mouth daily. (Patient not taking: Reported on 06/10/2020)   No facility-administered encounter medications on file as of 06/10/2020.     Objective: Blood pressure 115/72, pulse 76, temperature 99.1 F  (37.3 C), temperature source Oral, resp. rate 16, height 5' 10"  (1.778 m), weight 186 lb (84.4 kg). Patient is alert and in no acute distress. Patient is wearing a mask. Conjunctiva is pink. Sclera is nonicteric Oropharyngeal mucosa is normal. No neck masses or thyromegaly noted. Cardiac exam with regular rhythm normal S1 and S2. No murmur or gallop noted. Lungs are clear to auscultation. Abdomen is symmetrical.  On palpation is soft with mild tenderness at LLQ.  No organomegaly or masses. No LE edema or clubbing noted.  Labs/studies Results:  CBC Latest Ref Rng & Units 02/18/2020 02/05/2019  WBC 4.0 - 10.5 K/uL 9.1 11.3(H)  Hemoglobin 13.0 - 17.0 g/dL 13.3 11.9(L)  Hematocrit 39 - 52 % 43.1 36.1(L)  Platelets 150 - 400 K/uL 284 316    CMP Latest Ref Rng & Units 02/18/2020 02/05/2019  Glucose 70 - 99 mg/dL 86 117  BUN 6 - 20 mg/dL 12 10  Creatinine 0.61 - 1.24 mg/dL 1.12 0.91  Sodium 135 - 145 mmol/L 140 140  Potassium 3.5 - 5.1 mmol/L 5.0 4.5  Chloride 98 - 111 mmol/L 105 107  CO2 22 - 32 mmol/L 28 26  Calcium 8.9 - 10.3 mg/dL 9.4 8.7  Total Protein 6.5 - 8.1 g/dL 8.2(H) 7.0  Total Bilirubin 0.3 - 1.2 mg/dL 1.1 0.3  Alkaline Phos 38 - 126 U/L 86 -  AST 15 - 41 U/L 18 17  ALT 0 - 44 U/L  22 25    Hepatic Function Latest Ref Rng & Units 02/18/2020 02/05/2019  Total Protein 6.5 - 8.1 g/dL 8.2(H) 7.0  Albumin 3.5 - 5.0 g/dL 3.8 -  AST 15 - 41 U/L 18 17  ALT 0 - 44 U/L 22 25  Alk Phosphatase 38 - 126 U/L 86 -  Total Bilirubin 0.3 - 1.2 mg/dL 1.1 0.3    Lab Results  Component Value Date   CRP 0.6 02/18/2020      Assessment:  #1.  Pan ulcerative colitis which was diagnosed almost 16 weeks ago.  He did have symptomatic remission while he was on prednisone.  His disease has not responded to oral mesalamine.  He is having at least moderate symptoms.  Will retreat him with prednisone for longer interval and change oral mesalamine to a different agent.  If he fails steroid withdrawal  again would consider other treatment options which include immunomodulator and biologic.  Plan:  Discontinue Lialda. Begin Delzicol 1600 mg p.o. twice daily Prednisone 30 mg by mouth every morning for 1 week and thereafter drop dose by 5 mg every week. Patient will call with progress report when he finishes prednisone. Office visit in 6 months.

## 2020-06-10 NOTE — Patient Instructions (Signed)
Please call office with progress report when you finish prednisone.

## 2020-12-09 ENCOUNTER — Encounter (INDEPENDENT_AMBULATORY_CARE_PROVIDER_SITE_OTHER): Payer: Self-pay | Admitting: Internal Medicine

## 2020-12-09 ENCOUNTER — Ambulatory Visit (INDEPENDENT_AMBULATORY_CARE_PROVIDER_SITE_OTHER): Payer: 59 | Admitting: Internal Medicine

## 2020-12-09 ENCOUNTER — Other Ambulatory Visit: Payer: Self-pay

## 2020-12-09 VITALS — BP 146/91 | HR 86 | Temp 98.9°F | Ht 70.0 in | Wt 202.0 lb

## 2020-12-09 DIAGNOSIS — K5100 Ulcerative (chronic) pancolitis without complications: Secondary | ICD-10-CM

## 2020-12-09 MED ORDER — MESALAMINE 800 MG PO TBEC: 1600.0000 mg | DELAYED_RELEASE_TABLET | Freq: Two times a day (BID) | ORAL | 3 refills | Status: DC

## 2020-12-09 MED ORDER — LOPERAMIDE HCL 2 MG PO CAPS: 2.0000 mg | ORAL_CAPSULE | Freq: Every day | ORAL | Status: DC

## 2020-12-09 NOTE — Progress Notes (Signed)
Presenting complaint;  Follow-up for ulcerative colitis.  Database and subjective:  Patient is 39 year old male who was evaluated 9 months ago for chronic diarrhea and hematochezia.  Stool studies were negative.  Colonoscopy revealed pan ulcerative colitis(cecal sparing).  He he was initially treated with prednisone and has been maintained on oral mesalamine.  He returns for scheduled visit.  He was last seen 6 months ago. He feels better.  Stool frequency has decreased from close to 10 stools per day to 5 and 6.  Most of his stools are mushy.  Every now and then he has soft formed stool.  He denies melena or rectal bleeding or abdominal pain.  He is having to get up once every night to have a bowel movement.  Previously he was doing this more often.  His appetite is good.  He has gained 16 pounds in the last 9 months.  He works 7 days a week.  He does not do any exercise.  He has not had any accidents and his urgency has improved.  He is not having any side effects with mesalamine. He takes BC powder no more than 3-4 times in a month for headache.  Current Medications: Outpatient Encounter Medications as of 12/09/2020  Medication Sig   Aspirin-Salicylamide-Caffeine (BC FAST PAIN RELIEF) 650-195-33.3 MG PACK Take 1 packet by mouth daily as needed (headaches/pain.).   LACTOBACILLUS PROBIOTIC PO Take 1 capsule by mouth daily.   Mesalamine 800 MG TBEC Take 2 tablets (1,600 mg total) by mouth in the morning and at bedtime.   [DISCONTINUED] predniSONE (DELTASONE) 10 MG tablet Take 3 tablets (30 mg total) by mouth daily with breakfast. 30 mg daily for 1 week and thereafter drop dose by 5 mg every week.   No facility-administered encounter medications on file as of 12/09/2020.     Objective: Blood pressure (!) 146/91, pulse 86, temperature 98.9 F (37.2 C), temperature source Oral, height 5' 10"  (1.778 m), weight 202 lb (91.6 kg). Patient is alert and in no acute distress. He is wearing a  mask. Conjunctiva is pink. Sclera is nonicteric Oropharyngeal mucosa is normal. No neck masses or thyromegaly noted. Cardiac exam with regular rhythm normal S1 and S2. No murmur or gallop noted. Lungs are clear to auscultation. Abdomen is full but soft with mild tenderness at LLQ on deep palpation.  No organomegaly or masses.  No LE edema or clubbing noted.  Labs/studies Results:  CBC Latest Ref Rng & Units 02/18/2020 02/05/2019  WBC 4.0 - 10.5 K/uL 9.1 11.3(H)  Hemoglobin 13.0 - 17.0 g/dL 13.3 11.9(L)  Hematocrit 39.0 - 52.0 % 43.1 36.1(L)  Platelets 150 - 400 K/uL 284 316    CMP Latest Ref Rng & Units 02/18/2020 02/05/2019  Glucose 70 - 99 mg/dL 86 117  BUN 6 - 20 mg/dL 12 10  Creatinine 0.61 - 1.24 mg/dL 1.12 0.91  Sodium 135 - 145 mmol/L 140 140  Potassium 3.5 - 5.1 mmol/L 5.0 4.5  Chloride 98 - 111 mmol/L 105 107  CO2 22 - 32 mmol/L 28 26  Calcium 8.9 - 10.3 mg/dL 9.4 8.7  Total Protein 6.5 - 8.1 g/dL 8.2(H) 7.0  Total Bilirubin 0.3 - 1.2 mg/dL 1.1 0.3  Alkaline Phos 38 - 126 U/L 86 -  AST 15 - 41 U/L 18 17  ALT 0 - 44 U/L 22 25    Hepatic Function Latest Ref Rng & Units 02/18/2020 02/05/2019  Total Protein 6.5 - 8.1 g/dL 8.2(H) 7.0  Albumin 3.5 -  5.0 g/dL 3.8 -  AST 15 - 41 U/L 18 17  ALT 0 - 44 U/L 22 25  Alk Phosphatase 38 - 126 U/L 86 -  Total Bilirubin 0.3 - 1.2 mg/dL 1.1 0.3    Lab Results  Component Value Date   CRP 0.6 02/18/2020      Assessment:  #1.  Pan ulcerative colitis.  He is on oral mesalamine at a moderate dose.  Symptomatically there is significant improvement.  It remains to be seen if he is in remission.  He may have an element of IBS.  Plan:  Continue mesalamine at a dose of 1600 mg p.o. twice daily. Patient advised to keep aspirin use to minimum. Loperamide 2 mg p.o. every morning. Fecal calprotectin. Office visit in 6 months.

## 2020-12-09 NOTE — Patient Instructions (Signed)
Imodium/loperamide 2 mg daily with breakfast. Physician will call with results of stool test when completed.

## 2020-12-10 ENCOUNTER — Ambulatory Visit (INDEPENDENT_AMBULATORY_CARE_PROVIDER_SITE_OTHER): Payer: 59 | Admitting: Family Medicine

## 2020-12-10 ENCOUNTER — Encounter: Payer: Self-pay | Admitting: Family Medicine

## 2020-12-10 VITALS — BP 140/80 | HR 100 | Temp 98.7°F | Ht 70.0 in | Wt 201.0 lb

## 2020-12-10 DIAGNOSIS — Z23 Encounter for immunization: Secondary | ICD-10-CM | POA: Insufficient documentation

## 2020-12-10 DIAGNOSIS — Z87898 Personal history of other specified conditions: Secondary | ICD-10-CM

## 2020-12-10 DIAGNOSIS — K219 Gastro-esophageal reflux disease without esophagitis: Secondary | ICD-10-CM | POA: Insufficient documentation

## 2020-12-10 DIAGNOSIS — J3089 Other allergic rhinitis: Secondary | ICD-10-CM | POA: Insufficient documentation

## 2020-12-10 MED ORDER — LORATADINE 10 MG PO TABS: 10.0000 mg | ORAL_TABLET | Freq: Every day | ORAL | 11 refills | Status: DC

## 2020-12-10 MED ORDER — FAMOTIDINE 20 MG PO TABS: 20.0000 mg | ORAL_TABLET | Freq: Two times a day (BID) | ORAL | 1 refills | Status: DC

## 2020-12-10 NOTE — Patient Instructions (Signed)
  I appreciate the opportunity to provide you with care for your health and wellness. Today we discussed: overall health  Follow up: 4-6 weeks by phone; and we need to adjust the April appt 03/26/2021 (as scheduled- will need to be with another provider due to maternity leave)  No labs   Referrals today: Neurology   Flu shot today- tylenol for soreness if needed  Pepcid daily on empty stomach 30-60 mins prior to eating  Claritin can be anytime.  Intermittent fasting is a good way to help control weight.   Have a great holiday and Happy New Year!   Please continue to practice social distancing to keep you, your family, and our community safe.  If you must go out, please wear a mask and practice good handwashing.  It was a pleasure to see you and I look forward to continuing to work together on your health and well-being. Please do not hesitate to call the office if you need care or have questions about your care.  Have a wonderful day. With Gratitude, Cherly Beach, DNP, AGNP-BC

## 2020-12-10 NOTE — Assessment & Plan Note (Signed)
Referral replaced to neurology for f/u on hx of brain tumor plus removal.

## 2020-12-10 NOTE — Progress Notes (Addendum)
Subjective:  Patient ID: Cory James, male    DOB: 03-29-1981  Age: 39 y.o. MRN: [id number]  CC:  Chief Complaint  Patient presents with  . Sinus Problem    Ongoing for several years, worse during season change      HPI  HPI  Cory James is a pleasant 39 year old male patient of mine.  He presents today after having ongoing sinus problems.  He reports that he has had it off and on for the last several years.  He also reports that he did not note to have any of the sinus-like issues with drainage of the nasal passages or the right eye until after he had his brain tumor surgery years back.  He does report that he drains from both sides of his nose after he eats.  His right eye drains and is very watery.  Mostly clear drainage.  He thinks it might be a little bit worse with the season changes but overall he is pretty consistent with what he eats no matter what he eats he has the symptoms.  He denies having any headaches with it.  But does report a little bit of pressure across the forehead at times.  Denies having cough, shortness of breath, fever, chills.  Denies having any sore throat, ear pain.  He does endorse that he clears his throat a lot after eating.  Has never been diagnosed with reflux before.  Denies having any heartburn-like symptoms.    Additionally would also like a referral back to neuro as he was not able to follow-up with them  to set up care for himself down here.   Today patient denies signs and symptoms of COVID 19 infection including fever, chills, cough, shortness of breath, and headache. Past Medical, Surgical, Social History, Allergies, and Medications have been Reviewed.   Past Medical History:  Diagnosis Date  . Acute midline thoracic back pain 12/26/2019  . Adult acne 12/26/2019  . Allergy    seasonal  . Brain tumor (benign) ([place])   . Pancreatitis     Current Meds  Medication Sig  . Aspirin-Salicylamide-Caffeine (BC FAST PAIN RELIEF) 650-195-33.3 MG  PACK Take 1 packet by mouth daily as needed (headaches/pain.).  Marland Kitchen LACTOBACILLUS PROBIOTIC PO Take 1 capsule by mouth daily.  Marland Kitchen loperamide (IMODIUM) 2 MG capsule Take 1 capsule (2 mg total) by mouth daily with breakfast.  . Mesalamine 800 MG TBEC Take 2 tablets (1,600 mg total) by mouth in the morning and at bedtime.    ROS:  Review of Systems  HENT:       See hpi  Eyes: Negative.   Respiratory: Negative.   Cardiovascular: Negative.   Gastrointestinal: Negative.        See hpi  Genitourinary: Negative.   Musculoskeletal: Negative.   Skin: Negative.   Neurological: Negative.   Endo/Heme/Allergies: Negative.   Psychiatric/Behavioral: Negative.      Objective:   Today's Vitals: BP 140/80 (BP Location: Right Arm, Patient Position: Sitting, Cuff Size: Normal)   Pulse 100   Temp 98.7 F (37.1 C) (Tympanic)   Ht 5' 10"  (1.778 m)   Wt 201 lb (91.2 kg)   SpO2 98%   BMI 28.84 kg/m  Vitals with BMI 12/10/2020 12/09/2020 06/10/2020  Height 5' 10"  5' 10"  5' 10"   Weight 201 lbs 202 lbs 186 lbs  BMI 28.84 73.71 06.26  Systolic 948 546 270  Diastolic 80 91 72  Pulse 350 86 76  Physical Exam Vitals and nursing note reviewed.  Constitutional:      Appearance: Normal appearance. He is well-developed, well-groomed and overweight.  HENT:     Head: Normocephalic and atraumatic.     Right Ear: External ear normal.     Left Ear: External ear normal.     Mouth/Throat:     Comments: Mask in place  Eyes:     General:        Right eye: No discharge.        Left eye: No discharge.     Conjunctiva/sclera: Conjunctivae normal.  Cardiovascular:     Rate and Rhythm: Normal rate and regular rhythm.     Pulses: Normal pulses.     Heart sounds: Normal heart sounds.  Pulmonary:     Effort: Pulmonary effort is normal.     Breath sounds: Normal breath sounds.  Musculoskeletal:        General: Normal range of motion.     Cervical back: Normal range of motion and neck supple.  Skin:     General: Skin is warm.  Neurological:     General: No focal deficit present.     Mental Status: He is alert and oriented to person, place, and time.  Psychiatric:        Attention and Perception: Attention normal.        Mood and Affect: Mood normal.        Speech: Speech normal.        Behavior: Behavior normal. Behavior is cooperative.        Thought Content: Thought content normal.        Cognition and Memory: Cognition normal.        Judgment: Judgment normal.     Assessment   1. Gastroesophageal reflux disease, unspecified whether esophagitis present   2. Environmental and seasonal allergies   3. H/O brain tumor   4. Need for immunization against influenza     Tests ordered Orders Placed This Encounter  Procedures  . Flu Vaccine QUAD 36+ mos IM  . Ambulatory referral to Neurology     Plan: Please see assessment and plan per problem list above.   Meds ordered this encounter  Medications  . famotidine (PEPCID) 20 MG tablet    Sig: Take 1 tablet (20 mg total) by mouth 2 (two) times daily.    Dispense:  30 tablet    Refill:  1    Order Specific Question:   Supervising Provider    Answer:   SIMPSON, MARGARET E [[id number]]  . loratadine (CLARITIN) 10 MG tablet    Sig: Take 1 tablet (10 mg total) by mouth daily.    Dispense:  30 tablet    Refill:  11    Order Specific Question:   Supervising Provider    Answer:   Fayrene Helper [[id number]]    Patient to follow-up in 4-6 weeks    Perlie Mayo, NP

## 2020-12-10 NOTE — Assessment & Plan Note (Signed)
S&S might be allergy related, or related to brain tumor sx- as he reports it started post sx.  Claritin to try recommended.   Reviewed side effects, risks and benefits of medication.   Patient acknowledged agreement and understanding of the plan.

## 2020-12-10 NOTE — Assessment & Plan Note (Signed)
Start Pepcid S&S seem consistent with reflux of some degree. Follow up 4 weeks   Reviewed side effects, risks and benefits of medication.   Patient acknowledged agreement and understanding of the plan.

## 2020-12-10 NOTE — Assessment & Plan Note (Signed)

## 2020-12-28 ENCOUNTER — Emergency Department (HOSPITAL_COMMUNITY)
Admission: EM | Admit: 2020-12-28 | Discharge: 2020-12-28 | Disposition: A | Discharge status: Home / Self Care | Payer: 59 | Attending: Emergency Medicine | Admitting: Emergency Medicine

## 2020-12-28 ENCOUNTER — Encounter (HOSPITAL_COMMUNITY): Payer: Self-pay | Admitting: *Deleted

## 2020-12-28 ENCOUNTER — Other Ambulatory Visit: Payer: Self-pay

## 2020-12-28 DIAGNOSIS — U071 COVID-19: Secondary | ICD-10-CM | POA: Diagnosis not present

## 2020-12-28 DIAGNOSIS — R059 Cough, unspecified: Secondary | ICD-10-CM | POA: Diagnosis present

## 2020-12-28 DIAGNOSIS — Z5321 Procedure and treatment not carried out due to patient leaving prior to being seen by health care provider: Secondary | ICD-10-CM | POA: Diagnosis not present

## 2020-12-28 HISTORY — DX: Ulcerative colitis, unspecified, without complications: K51.90

## 2020-12-28 LAB — RESP PANEL BY RT-PCR (FLU A&B, COVID) ARPGX2
Influenza A by PCR: NEGATIVE
Influenza B by PCR: NEGATIVE
SARS Coronavirus 2 by RT PCR: POSITIVE — AB

## 2020-12-28 NOTE — ED Triage Notes (Signed)
Pt with productive cough last night but dry today and fever x 3 days.  Pt denies any known Covid exposure and has had covid vaccines.

## 2020-12-28 NOTE — ED Notes (Signed)
Date and time results received: 12/28/20 1710(use smartphrase ".now" to insert current time)  Test: covid Critical Value: positive  Name of Provider Notified:Dr Zackowski  Orders Received? Or Actions Taken?: na

## 2021-01-03 ENCOUNTER — Encounter (HOSPITAL_COMMUNITY): Payer: Self-pay | Admitting: Emergency Medicine

## 2021-01-03 ENCOUNTER — Other Ambulatory Visit: Payer: Self-pay

## 2021-01-03 ENCOUNTER — Emergency Department (HOSPITAL_COMMUNITY)
Admission: EM | Admit: 2021-01-03 | Discharge: 2021-01-03 | Disposition: A | Discharge status: Home / Self Care | Payer: 59 | Attending: Emergency Medicine | Admitting: Emergency Medicine

## 2021-01-03 ENCOUNTER — Emergency Department (HOSPITAL_COMMUNITY): Payer: 59

## 2021-01-03 DIAGNOSIS — F17290 Nicotine dependence, other tobacco product, uncomplicated: Secondary | ICD-10-CM | POA: Insufficient documentation

## 2021-01-03 DIAGNOSIS — R059 Cough, unspecified: Secondary | ICD-10-CM

## 2021-01-03 DIAGNOSIS — U071 COVID-19: Secondary | ICD-10-CM | POA: Diagnosis not present

## 2021-01-03 DIAGNOSIS — Z79899 Other long term (current) drug therapy: Secondary | ICD-10-CM | POA: Diagnosis not present

## 2021-01-03 MED ORDER — BENZONATATE 100 MG PO CAPS: 100.0000 mg | ORAL_CAPSULE | Freq: Three times a day (TID) | ORAL | 0 refills | Status: DC | PRN

## 2021-01-03 MED ORDER — PREDNISONE 10 MG (21) PO TBPK: ORAL_TABLET | Freq: Every day | ORAL | 0 refills | Status: DC

## 2021-01-03 NOTE — ED Triage Notes (Signed)
Pt tested Covid positive on 12/28/20.   Pt states his cough is getting worse and he is short of breath, but his oxygen saturation in triage is 100 percent.

## 2021-01-03 NOTE — Discharge Instructions (Addendum)
Like we discussed, I prescribed you 2 medications.  First medication is called Best boy.  This is a cough medication you can take up to 3 times a day.  Take this as needed.  If you find that this is not providing relief, consider trying Delsym.  You can buy this over-the-counter.  I am also prescribing you a steroid medication called prednisone.  This medication can be stimulating to try to take it earlier in the day as it can make it difficult to sleep.  This is a tapered medications so as you take it on a daily basis you will take less and less.  Make sure that you complete this medication.  Please follow-up with your primary doctor as needed for your symptoms.  If they worsen, you can always return to the emergency department.  It was a pleasure to meet you.

## 2021-01-03 NOTE — ED Notes (Signed)
Entered room and introduced self to patient. Pt appears to be resting in bed, respirations are even and unlabored with equal chest rise and fall. Call bell within reach. Pt educated on call light use and hourly rounding, verbalized understanding and in agreement at this time. All questions and concerns voiced addressed. Refreshments offered and provided per patient request.

## 2021-01-03 NOTE — ED Notes (Signed)
PA at bedside at this time.

## 2021-01-03 NOTE — ED Provider Notes (Signed)
[facility] EMERGENCY DEPARTMENT Provider Note   CSN: [id number] Arrival date & time: 01/03/21  1519     History Chief Complaint  Patient presents with  . Covid Positive    Cory James is a 40 y.o. male.  HPI   Patient is a 40 year old male with a history of brain tumor, ulcerative colitis, who presents to the emergency department due to cough. Patient has been vaccinated for COVID-19 x2 and states that he began experiencing viral URI symptoms about 10 days ago. He was diagnosed with COVID 6 days ago. He states that his symptoms have mostly improved but he has continued to experience a productive cough with yellow sputum. He is a smoker and states that he switched to vaping while sick with COVID. Initially reports some diarrhea fevers, fatigue, and rhinorrhea. Symptoms have mostly improved. Patient denies any chest pain, shortness of breath, abdominal pain, nausea, vomiting.     Past Medical History:  Diagnosis Date  . Acute midline thoracic back pain 12/26/2019  . Adult acne 12/26/2019  . Allergy    seasonal  . Brain tumor (benign) ([place])   . Colitis, ulcerative ([place])   . Pancreatitis     Patient Active Problem List   Diagnosis Date Noted  . Gastroesophageal reflux disease 12/10/2020  . Environmental and seasonal allergies 12/10/2020  . Need for immunization against influenza 12/10/2020  . Ulcerative colitis ([place]) 06/10/2020  . H/O brain tumor 03/25/2020  . Numbness 03/25/2020  . Chronic diarrhea 02/05/2020  . Irritable bowel syndrome with diarrhea 11/28/2019    Past Surgical History:  Procedure Laterality Date  . BRAIN SURGERY    . COLONOSCOPY WITH PROPOFOL N/A 02/18/2020   Procedure: COLONOSCOPY WITH PROPOFOL;  Surgeon: Rogene Houston, MD;  Location: [facility];  Service: Endoscopy;  Laterality: N/A;       Family History  Problem Relation Age of Onset  . Hypertension Mother   . Diabetes Mother   . Healthy Father   . Healthy Sister     Social History    Tobacco Use  . Smoking status: Current Every Day Smoker    Packs/day: 0.50    Years: 18.00    Pack years: 9.00    Types: E-cigarettes  . Smokeless tobacco: Never Used  Vaping Use  . Vaping Use: Every day  . Substances: Nicotine  Substance Use Topics  . Alcohol use: Not Currently    Comment: rarely  . Drug use: Not Currently    Comment: occassion use    Home Medications Prior to Admission medications   Medication Sig Start Date End Date Taking? Authorizing Provider  benzonatate (TESSALON) 100 MG capsule Take 1 capsule (100 mg total) by mouth 3 (three) times daily as needed for cough. 01/03/21  Yes Rayna Sexton, PA-C  predniSONE (STERAPRED UNI-PAK 21 TAB) 10 MG (21) TBPK tablet Take by mouth daily. Take 6 tabs by mouth daily  for 2 days, then 5 tabs for 2 days, then 4 tabs for 2 days, then 3 tabs for 2 days, 2 tabs for 2 days, then 1 tab by mouth daily for 2 days 01/03/21  Yes Rayna Sexton, PA-C  Aspirin-Salicylamide-Caffeine (BC FAST PAIN RELIEF) 650-195-33.3 MG PACK Take 1 packet by mouth daily as needed (headaches/pain.).    [provider]  famotidine (PEPCID) 20 MG tablet Take 1 tablet (20 mg total) by mouth 2 (two) times daily. 12/10/20   Perlie Mayo, NP  LACTOBACILLUS PROBIOTIC PO Take 1 capsule by mouth daily.  [provider]  loperamide (IMODIUM) 2 MG capsule Take 1 capsule (2 mg total) by mouth daily with breakfast. 12/09/20   Rehman, Mechele Dawley, MD  loratadine (CLARITIN) 10 MG tablet Take 1 tablet (10 mg total) by mouth daily. 12/10/20   Perlie Mayo, NP  Mesalamine 800 MG TBEC Take 2 tablets (1,600 mg total) by mouth in the morning and at bedtime. 12/09/20   Rogene Houston, MD    Allergies    Patient has no known allergies.  Review of Systems   Review of Systems  Constitutional: Positive for fatigue. Negative for chills and fever.  HENT: Negative for congestion.   Respiratory: Positive for cough. Negative for shortness of breath.    Cardiovascular: Negative for chest pain.  Gastrointestinal: Positive for diarrhea. Negative for abdominal pain, nausea and vomiting.    Physical Exam Updated Vital Signs BP (!) 158/97 (BP Location: Right Arm)   Pulse 78   Temp 98.4 F (36.9 C) (Oral)   Resp 17   Ht 5' 10"  (1.778 m)   Wt 91.6 kg   SpO2 100%   BMI 28.98 kg/m   Physical Exam Vitals and nursing note reviewed.  Constitutional:      General: He is not in acute distress.    Appearance: Normal appearance. He is not ill-appearing, toxic-appearing or diaphoretic.  HENT:     Head: Normocephalic and atraumatic.     Right Ear: External ear normal.     Left Ear: External ear normal.     Nose: Nose normal.     Mouth/Throat:     Mouth: Mucous membranes are moist.     Pharynx: Oropharynx is clear. No oropharyngeal exudate or posterior oropharyngeal erythema.  Eyes:     Extraocular Movements: Extraocular movements intact.  Cardiovascular:     Rate and Rhythm: Normal rate and regular rhythm.     Pulses: Normal pulses.     Heart sounds: Normal heart sounds. No murmur heard. No friction rub. No gallop.      Comments: Regular rate and rhythm without murmurs, rubs, or gallops. Pulmonary:     Effort: Pulmonary effort is normal. No respiratory distress.     Breath sounds: Normal breath sounds. No stridor. No wheezing, rhonchi or rales.     Comments: Lungs are clear to auscultation bilaterally. No wheezing, rales, or rhonchi. Oxygen saturations at 100% on room air. Patient phonating and clear, complete, and coherent sentences. No respiratory distress noted. Abdominal:     General: Abdomen is flat.     Tenderness: There is no abdominal tenderness.  Musculoskeletal:        General: Normal range of motion.     Cervical back: Normal range of motion and neck supple. No tenderness.  Skin:    General: Skin is warm and dry.  Neurological:     General: No focal deficit present.     Mental Status: He is alert and oriented to  person, place, and time.  Psychiatric:        Mood and Affect: Mood normal.        Behavior: Behavior normal.    ED Results / Procedures / Treatments   Labs (all labs ordered are listed, but only abnormal results are displayed) Labs Reviewed - No data to display  EKG None  Radiology DG Chest Portable 1 View  Result Date: 01/03/2021 CLINICAL DATA:  Worsening cough and shortness of breath. EXAM: PORTABLE CHEST 1 VIEW COMPARISON:  None. FINDINGS: The heart size and mediastinal  contours are within normal limits. Both lungs are clear. The visualized skeletal structures are unremarkable. IMPRESSION: No active disease. Electronically Signed   By: Virgina Norfolk M.D.   On: 01/03/2021 17:22    Procedures Procedures (including critical care time)  Medications Ordered in ED Medications - No data to display  ED Course  I have reviewed the triage vital signs and the nursing notes.  Pertinent labs & imaging results that were available during my care of the patient were reviewed by me and considered in my medical decision making (see chart for details).  Clinical Course as of 01/03/21 1737  Sat Jan 03, 2021  1727 DG Chest Portable 1 View IMPRESSION: No active disease. [LJ]    Clinical Course User Index [LJ] Rayna Sexton, PA-C   MDM Rules/Calculators/A&P                          Patient is a 40 year old male who was diagnosed with COVID-19 about 6 days ago.  His symptoms started about 10 days ago.  His symptoms have mostly resolved but he has continued to experience a productive cough.  I obtained a chest x-ray which shows no active disease at this time.  No pneumonia.  Patient is a smoker.  Feel that his symptoms are likely related to recent COVID infection as well as his smoking status.  Possibly bronchitis.  Patient denies any chest pain or shortness of breath at this time.  Reassuring vital signs without tachycardia or hypoxia.  Will discharge on a prednisone taper.  We will  also prescribe Tessalon Perles.  We discussed other over-the-counter medications for cough if he finds that [employer] do not provide relief.  We discussed return precautions.  PCP follow-up.  His questions were answered and he was amicable at the time of discharge.  Final Clinical Impression(s) / ED Diagnoses Final diagnoses:  COVID-19  Cough    Rx / DC Orders ED Discharge Orders         Ordered    predniSONE (STERAPRED UNI-PAK 21 TAB) 10 MG (21) TBPK tablet  Daily        01/03/21 1733    benzonatate (TESSALON) 100 MG capsule  3 times daily PRN        01/03/21 1733           Rayna Sexton, PA-C 01/03/21 1740    Long, [facility], MD 01/04/21 1552

## 2021-01-21 ENCOUNTER — Telehealth: Payer: 59 | Admitting: Family Medicine

## 2021-03-26 ENCOUNTER — Other Ambulatory Visit: Payer: Self-pay

## 2021-03-26 ENCOUNTER — Encounter: Payer: Self-pay | Admitting: Nurse Practitioner

## 2021-03-26 ENCOUNTER — Ambulatory Visit (INDEPENDENT_AMBULATORY_CARE_PROVIDER_SITE_OTHER): Payer: 59 | Admitting: Nurse Practitioner

## 2021-03-26 VITALS — BP 146/99 | HR 80 | Temp 98.2°F | Resp 20 | Ht 70.0 in | Wt 198.0 lb

## 2021-03-26 DIAGNOSIS — R0683 Snoring: Secondary | ICD-10-CM | POA: Diagnosis not present

## 2021-03-26 DIAGNOSIS — G9331 Postviral fatigue syndrome: Secondary | ICD-10-CM

## 2021-03-26 DIAGNOSIS — R0981 Nasal congestion: Secondary | ICD-10-CM | POA: Insufficient documentation

## 2021-03-26 DIAGNOSIS — Z139 Encounter for screening, unspecified: Secondary | ICD-10-CM | POA: Insufficient documentation

## 2021-03-26 DIAGNOSIS — H7292 Unspecified perforation of tympanic membrane, left ear: Secondary | ICD-10-CM

## 2021-03-26 DIAGNOSIS — Z0000 Encounter for general adult medical examination without abnormal findings: Secondary | ICD-10-CM

## 2021-03-26 DIAGNOSIS — G933 Postviral fatigue syndrome: Secondary | ICD-10-CM

## 2021-03-26 DIAGNOSIS — G4733 Obstructive sleep apnea (adult) (pediatric): Secondary | ICD-10-CM | POA: Insufficient documentation

## 2021-03-26 DIAGNOSIS — R5383 Other fatigue: Secondary | ICD-10-CM | POA: Insufficient documentation

## 2021-03-26 NOTE — Assessment & Plan Note (Signed)
-  has had congestion since having COVID in January -Rx. flonase -will refer to ENT -? Connection to perforated TM, although I see no sign of infection in ear

## 2021-03-26 NOTE — Assessment & Plan Note (Signed)
Screen for HIV and HCV with next set of labs

## 2021-03-26 NOTE — Patient Instructions (Signed)
Please have fasting labs drawn at some point this week.

## 2021-03-26 NOTE — Progress Notes (Signed)
Established Patient Office Visit  Subjective:  Patient ID: Cory James, male    DOB: [birth year]  Age: 39 y.o. MRN: [id number]  CC:  Chief Complaint  Patient presents with  . Annual Exam    Pt diagnosed with Covid in January, ever since has had SOB and congestion, fatigue.     HPI Cory James presents for annual physical exam.  He states that he has had a lot of congestion since having COVID.  He wakes up at night and is congested and he hasn't tried anything for it.  He states that some nights he wakes up in the middle of the night and is short of breath and it takes him about 5 minutes to catch his breath. He has no angina with exertion or SOB with exertion.  He states his wife took a video of him snoring at night.  He sees Dr. Laural Golden for ulcerative colitis.  He states that his medication is not working as well as previously.  Past Medical History:  Diagnosis Date  . Acute midline thoracic back pain 12/26/2019  . Adult acne 12/26/2019  . Allergy    seasonal  . Brain tumor (benign) ([place])   . Colitis, ulcerative ([place])   . Pancreatitis     Past Surgical History:  Procedure Laterality Date  . BRAIN SURGERY    . COLONOSCOPY WITH PROPOFOL N/A 02/18/2020   Procedure: COLONOSCOPY WITH PROPOFOL;  Surgeon: Rogene Houston, MD;  Location: [facility];  Service: Endoscopy;  Laterality: N/A;    Family History  Problem Relation Age of Onset  . Hypertension Mother   . Diabetes Mother   . Healthy Father   . Healthy Sister     Social History   Socioeconomic History  . Marital status: Married    Spouse name: [name]  . Number of children: 2  . Years of education: 34  . Highest education level: GED or equivalent  Occupational History  . Not on file  Tobacco Use  . Smoking status: Current Every Day Smoker    Packs/day: 0.50    Years: 18.00    Pack years: 9.00    Types: E-cigarettes  . Smokeless tobacco: Never Used  Vaping Use  . Vaping Use: Every day  . Substances:  Nicotine  Substance and Sexual Activity  . Alcohol use: Not Currently    Comment: rarely  . Drug use: Not Currently    Comment: occassion use  . Sexual activity: Yes  Other Topics Concern  . Not on file  Social History Narrative   Married for 10 years to [place]      Two Children:   2020      Vansh 71 years old      Rutledge 39 years old      No pets    Social Determinants of Health   Financial Resource Strain: Not on file  Food Insecurity: Not on file  Transportation Needs: Not on file  Physical Activity: Not on file  Stress: Not on file  Social Connections: Not on file  Intimate Partner Violence: Not on file    Outpatient Medications Prior to Visit  Medication Sig Dispense Refill  . Aspirin-Salicylamide-Caffeine (BC FAST PAIN RELIEF) 650-195-33.3 MG PACK Take 1 packet by mouth daily as needed (headaches/pain.).    Marland Kitchen famotidine (PEPCID) 20 MG tablet Take 1 tablet (20 mg total) by mouth 2 (two) times daily. 30 tablet 1  . LACTOBACILLUS PROBIOTIC PO Take 1 capsule by mouth daily.    Marland Kitchen  loperamide (IMODIUM) 2 MG capsule Take 1 capsule (2 mg total) by mouth daily with breakfast.    . loratadine (CLARITIN) 10 MG tablet Take 1 tablet (10 mg total) by mouth daily. 30 tablet 11  . Mesalamine 800 MG TBEC Take 2 tablets (1,600 mg total) by mouth in the morning and at bedtime. 360 tablet 3  . benzonatate (TESSALON) 100 MG capsule Take 1 capsule (100 mg total) by mouth 3 (three) times daily as needed for cough. 21 capsule 0  . predniSONE (STERAPRED UNI-PAK 21 TAB) 10 MG (21) TBPK tablet Take by mouth daily. Take 6 tabs by mouth daily  for 2 days, then 5 tabs for 2 days, then 4 tabs for 2 days, then 3 tabs for 2 days, 2 tabs for 2 days, then 1 tab by mouth daily for 2 days 42 tablet 0   No facility-administered medications prior to visit.    No Known Allergies  ROS Review of Systems  Constitutional: Negative.   HENT: Positive for congestion.   Eyes: Negative.   Respiratory: Negative.    Cardiovascular: Negative.   Gastrointestinal: Negative.   Endocrine: Negative.   Genitourinary: Negative.   Musculoskeletal: Negative.   Allergic/Immunologic: Negative.   Neurological: Negative.   Hematological: Negative.   Psychiatric/Behavioral: Negative.       Objective:    Physical Exam Constitutional:      Appearance: Normal appearance.  HENT:     Head: Normocephalic and atraumatic.     Right Ear: Tympanic membrane, ear canal and external ear normal.     Left Ear: Ear canal and external ear normal.     Ears:     Comments: Left TM perforated    Nose: Nose normal.     Mouth/Throat:     Mouth: Mucous membranes are moist.     Pharynx: Oropharynx is clear.  Eyes:     Extraocular Movements: Extraocular movements intact.     Conjunctiva/sclera: Conjunctivae normal.     Pupils: Pupils are equal, round, and reactive to light.  Cardiovascular:     Rate and Rhythm: Normal rate and regular rhythm.     Pulses: Normal pulses.     Heart sounds: Normal heart sounds.  Pulmonary:     Effort: Pulmonary effort is normal.     Breath sounds: Normal breath sounds.  Abdominal:     General: Abdomen is flat. Bowel sounds are normal.     Palpations: Abdomen is soft.  Musculoskeletal:        General: Normal range of motion.     Cervical back: Normal range of motion and neck supple.  Skin:    General: Skin is warm and dry.     Capillary Refill: Capillary refill takes less than 2 seconds.  Neurological:     General: No focal deficit present.     Mental Status: He is alert and oriented to person, place, and time.     Cranial Nerves: No cranial nerve deficit.     Sensory: No sensory deficit.     Motor: No weakness.     Coordination: Coordination normal.     Gait: Gait normal.  Psychiatric:        Mood and Affect: Mood normal.        Behavior: Behavior normal.        Thought Content: Thought content normal.        Judgment: Judgment normal.     BP (!) 146/99   Pulse 80   Temp  98.2  F (36.8 C)   Resp 20   Ht 5' 10" (1.778 m)   Wt 198 lb (89.8 kg)   SpO2 98%   BMI 28.41 kg/m  Wt Readings from Last 3 Encounters:  03/26/21 198 lb (89.8 kg)  01/03/21 202 lb (91.6 kg)  12/10/20 201 lb (91.2 kg)     Health Maintenance Due  Topic Date Due  . Hepatitis C Screening  Never done  . COVID-19 Vaccine (1) Never done  . HIV Screening  Never done  . TETANUS/TDAP  Never done    There are no preventive care reminders to display for this patient.  No results found for: TSH Lab Results  Component Value Date   WBC 9.1 02/18/2020   HGB 13.3 02/18/2020   HCT 43.1 02/18/2020   MCV 80.3 02/18/2020   PLT 284 02/18/2020   Lab Results  Component Value Date   NA 140 02/18/2020   K 5.0 02/18/2020   CO2 28 02/18/2020   GLUCOSE 86 02/18/2020   BUN 12 02/18/2020   CREATININE 1.12 02/18/2020   BILITOT 1.1 02/18/2020   ALKPHOS 86 02/18/2020   AST 18 02/18/2020   ALT 22 02/18/2020   PROT 8.2 (H) 02/18/2020   ALBUMIN 3.8 02/18/2020   CALCIUM 9.4 02/18/2020   ANIONGAP 7 02/18/2020   No results found for: CHOL No results found for: HDL No results found for: LDLCALC No results found for: TRIG No results found for: CHOLHDL No results found for: HGBA1C    Assessment & Plan:   Problem List Items Addressed This Visit      Other   Fatigue    -fatigue started after COVID -will check thyroid function today      Relevant Orders   TSH   Chronic nasal congestion    -has had congestion since having COVID in January -Rx. flonase -will refer to ENT -? Connection to perforated TM, although I see no sign of infection in ear      Relevant Orders   Ambulatory referral to ENT   Snoring    -set up for home sleep study to r/o sleep apnea -wakes up SOB in middle of night; no SOB at other times or with exertion -if no improvement may consider cardiology      Relevant Orders   Home sleep test   Screening due    Screen for HIV and HCV with next set of labs       Relevant Orders   HCV Ab w/Rflx to Verification   HIV Antibody (routine testing w rflx)    Other Visit Diagnoses    Annual physical exam    -  Primary   Relevant Orders   CBC with Differential/Platelet   CMP14+EGFR   Lipid Panel With LDL/HDL Ratio   TSH   HCV Ab w/Rflx to Verification   HIV Antibody (routine testing w rflx)   Perforated ear drum, left       Relevant Orders   Ambulatory referral to ENT      No orders of the defined types were placed in this encounter.   Follow-up: Return in about 3 months (around 06/25/2021) for Follow-up for fatigue/congestion.    Noreene Larsson, NP

## 2021-03-26 NOTE — Assessment & Plan Note (Signed)
-  fatigue started after COVID -will check thyroid function today

## 2021-03-26 NOTE — Assessment & Plan Note (Signed)
-  set up for home sleep study to r/o sleep apnea -wakes up SOB in middle of night; no SOB at other times or with exertion -if no improvement may consider cardiology

## 2021-04-27 ENCOUNTER — Ambulatory Visit (INDEPENDENT_AMBULATORY_CARE_PROVIDER_SITE_OTHER): Payer: 59 | Admitting: Otolaryngology

## 2021-05-01 ENCOUNTER — Encounter (INDEPENDENT_AMBULATORY_CARE_PROVIDER_SITE_OTHER): Payer: Self-pay | Admitting: Otolaryngology

## 2021-05-01 ENCOUNTER — Other Ambulatory Visit: Payer: Self-pay

## 2021-05-01 ENCOUNTER — Ambulatory Visit (INDEPENDENT_AMBULATORY_CARE_PROVIDER_SITE_OTHER): Payer: 59 | Admitting: Otolaryngology

## 2021-05-01 VITALS — Temp 97.7°F

## 2021-05-01 DIAGNOSIS — J342 Deviated nasal septum: Secondary | ICD-10-CM | POA: Diagnosis not present

## 2021-05-01 DIAGNOSIS — J3489 Other specified disorders of nose and nasal sinuses: Secondary | ICD-10-CM | POA: Diagnosis not present

## 2021-05-01 DIAGNOSIS — H9041 Sensorineural hearing loss, unilateral, right ear, with unrestricted hearing on the contralateral side: Secondary | ICD-10-CM

## 2021-05-01 DIAGNOSIS — H7292 Unspecified perforation of tympanic membrane, left ear: Secondary | ICD-10-CM

## 2021-05-01 DIAGNOSIS — J310 Chronic rhinitis: Secondary | ICD-10-CM | POA: Diagnosis not present

## 2021-05-01 NOTE — Progress Notes (Signed)
HPI: Cory James is a 40 y.o. male who presents is referred by Cory Revel, NP for evaluation of chronic nasal congestion.  Patient describes "sinuses" being blocked off and having a hard time breathing through his nose.  He also describes sinus pressure.  He stopped smoking 5 months ago and thought his nasal sinus symptoms would improve when he stopped smoking.  He apparently had COVID back in January.  He has not been using any steroid sprays on a regular basis but has used occasional saline irrigation.  He also complains of dry mouth. He has had a previous history of a right acoustic neuroma removed in [place] 3 years ago and has had permanent hearing loss in the right ear.  He was recently noted to have a perforation of the left TM but denies any active drainage from the left ear.  He is not sure how he developed a perforation in the left ear..  Past Medical History:  Diagnosis Date  . Acute midline thoracic back pain 12/26/2019  . Adult acne 12/26/2019  . Allergy    seasonal  . Brain tumor (benign) ([place])   . Colitis, ulcerative ([place])   . Pancreatitis    Past Surgical History:  Procedure Laterality Date  . BRAIN SURGERY    . COLONOSCOPY WITH PROPOFOL N/A 02/18/2020   Procedure: COLONOSCOPY WITH PROPOFOL;  Surgeon: Rogene Houston, MD;  Location: [facility];  Service: Endoscopy;  Laterality: N/A;   Social History   Socioeconomic History  . Marital status: Married    Spouse name: Cory James  . Number of children: 2  . Years of education: 77  . Highest education level: GED or equivalent  Occupational History  . Not on file  Tobacco Use  . Smoking status: Current Every Day Smoker    Packs/day: 0.50    Years: 18.00    Pack years: 9.00    Types: E-cigarettes  . Smokeless tobacco: Never Used  . Tobacco comment: stopped smoking Jan.2022  Vaping Use  . Vaping Use: Every day  . Substances: Nicotine  Substance and Sexual Activity  . Alcohol use: Not Currently    Comment: rarely   . Drug use: Not Currently    Comment: occassion use  . Sexual activity: Yes  Other Topics Concern  . Not on file  Social History Narrative   Married for 10 years to [place]      Two Children:   2020      Cory James 58 years old      Cory James 5 years old      No pets    Social Determinants of Health   Financial Resource Strain: Not on file  Food Insecurity: Not on file  Transportation Needs: Not on file  Physical Activity: Not on file  Stress: Not on file  Social Connections: Not on file   Family History  Problem Relation Age of Onset  . Hypertension Mother   . Diabetes Mother   . Healthy Father   . Healthy Sister    No Known Allergies Prior to Admission medications   Medication Sig Start Date End Date Taking? Authorizing Provider  Aspirin-Salicylamide-Caffeine (BC FAST PAIN RELIEF) 650-195-33.3 MG PACK Take 1 packet by mouth daily as needed (headaches/pain.).    [provider]  famotidine (PEPCID) 20 MG tablet Take 1 tablet (20 mg total) by mouth 2 (two) times daily. 12/10/20   Perlie Mayo, NP  LACTOBACILLUS PROBIOTIC PO Take 1 capsule by mouth daily.  [provider]  loperamide (IMODIUM) 2 MG capsule Take 1 capsule (2 mg total) by mouth daily with breakfast. 12/09/20   Rehman, Mechele Dawley, MD  loratadine (CLARITIN) 10 MG tablet Take 1 tablet (10 mg total) by mouth daily. 12/10/20   Perlie Mayo, NP  Mesalamine 800 MG TBEC Take 2 tablets (1,600 mg total) by mouth in the morning and at bedtime. 12/09/20   Rogene Houston, MD     Positive ROS: Otherwise negative  All other systems have been reviewed and were otherwise negative with the exception of those mentioned in the HPI and as above.  Physical Exam: Constitutional: Alert, well-appearing, no acute distress Ears: External ears without lesions or tenderness. Ear canals are clear bilaterally.  Right TM is clear.  Left TM reveals a small posterior TM perforation which is dry with no active drainage  noted.  The TM is otherwise clear. Nasal: External nose without lesions. Septum is deviated to the right.  After decongesting the nose nasal endoscopy was performed and on nasal endoscopy there were no nasal polyps noted.  Mucus discharge from the nasal cavity was clear.  The nasopharynx was clear.  He had moderate adenoid tissue but no nasopharyngeal masses noted.. Clear nasal passages Oral: Lips and gums without lesions. Tongue and palate mucosa without lesions. Posterior oropharynx clear. Neck: No palpable adenopathy or masses Respiratory: Breathing comfortably  Skin: No facial/neck lesions or rash noted.  Procedures  Assessment: Chronic rhinitis with septal deviation to the right.  Chronic nasal obstruction. Left TM perforation questionable etiology. Deaf in the right ear status post removal of acoustic neuroma  Plan: Concerning the chronic nasal obstruction I discussed with him concerning regular use of nasal steroid spray and prescribed Nasacort 2 sprays each nostril at night.  He apparently has tried Flonase intermittently in the past without much benefit.  For the nasal discharge recommended use of saline irrigations and gave him samples of the Nettie pot and NeilMed saline flushes.  Reviewed with him concerning using saline rinses when he is having a lot of mucus discharge from the nose and use of the Nasacort 2 sprays each nostril at night. Briefly reviewed with him concerning surgical options if he does not get adequate benefit with medical treatment.  If he continues to have chronic nasal obstruction after 6 to 8 weeks of medical therapy he will call us back to schedule a limited CT scan of the sinuses and discuss possible surgical intervention. Discussed with him concerning keeping water and liquids out of the left ear canal as he has a TM perforation and if he has any drainage from the ear he will call us to be treated with antibiotic eardrops.   Radene Journey, MD   CC:

## 2021-05-28 ENCOUNTER — Other Ambulatory Visit: Payer: Self-pay

## 2021-05-28 ENCOUNTER — Encounter (HOSPITAL_COMMUNITY): Payer: Self-pay | Admitting: *Deleted

## 2021-05-28 ENCOUNTER — Emergency Department (HOSPITAL_COMMUNITY): Payer: 59

## 2021-05-28 ENCOUNTER — Emergency Department (HOSPITAL_COMMUNITY)
Admission: EM | Admit: 2021-05-28 | Discharge: 2021-05-28 | Disposition: A | Discharge status: Home / Self Care | Payer: 59 | Attending: Emergency Medicine | Admitting: Emergency Medicine

## 2021-05-28 DIAGNOSIS — K859 Acute pancreatitis without necrosis or infection, unspecified: Secondary | ICD-10-CM | POA: Insufficient documentation

## 2021-05-28 DIAGNOSIS — K219 Gastro-esophageal reflux disease without esophagitis: Secondary | ICD-10-CM | POA: Diagnosis not present

## 2021-05-28 DIAGNOSIS — K529 Noninfective gastroenteritis and colitis, unspecified: Secondary | ICD-10-CM | POA: Diagnosis not present

## 2021-05-28 DIAGNOSIS — F17210 Nicotine dependence, cigarettes, uncomplicated: Secondary | ICD-10-CM | POA: Diagnosis not present

## 2021-05-28 DIAGNOSIS — R1013 Epigastric pain: Secondary | ICD-10-CM | POA: Diagnosis present

## 2021-05-28 LAB — URINALYSIS, ROUTINE W REFLEX MICROSCOPIC
Bacteria, UA: NONE SEEN
Bilirubin Urine: NEGATIVE
Glucose, UA: NEGATIVE mg/dL
Ketones, ur: NEGATIVE mg/dL
Leukocytes,Ua: NEGATIVE
Nitrite: NEGATIVE
Protein, ur: NEGATIVE mg/dL
Specific Gravity, Urine: 1.021 (ref 1.005–1.030)
pH: 5 (ref 5.0–8.0)

## 2021-05-28 LAB — CBC WITH DIFFERENTIAL/PLATELET
Abs Immature Granulocytes: 0.1 10*3/uL — ABNORMAL HIGH (ref 0.00–0.07)
Basophils Absolute: 0.1 10*3/uL (ref 0.0–0.1)
Basophils Relative: 1 %
Eosinophils Absolute: 0.4 10*3/uL (ref 0.0–0.5)
Eosinophils Relative: 3 %
HCT: 42.8 % (ref 39.0–52.0)
Hemoglobin: 13.5 g/dL (ref 13.0–17.0)
Immature Granulocytes: 1 %
Lymphocytes Relative: 23 %
Lymphs Abs: 2.9 10*3/uL (ref 0.7–4.0)
MCH: 25.7 pg — ABNORMAL LOW (ref 26.0–34.0)
MCHC: 31.5 g/dL (ref 30.0–36.0)
MCV: 81.5 fL (ref 80.0–100.0)
Monocytes Absolute: 0.7 10*3/uL (ref 0.1–1.0)
Monocytes Relative: 5 %
Neutro Abs: 8.2 10*3/uL — ABNORMAL HIGH (ref 1.7–7.7)
Neutrophils Relative %: 67 %
Platelets: 251 10*3/uL (ref 150–400)
RBC: 5.25 MIL/uL (ref 4.22–5.81)
RDW: 13.2 % (ref 11.5–15.5)
WBC: 12.3 10*3/uL — ABNORMAL HIGH (ref 4.0–10.5)
nRBC: 0 % (ref 0.0–0.2)

## 2021-05-28 LAB — COMPREHENSIVE METABOLIC PANEL
ALT: 21 U/L (ref 0–44)
AST: 16 U/L (ref 15–41)
Albumin: 3.8 g/dL (ref 3.5–5.0)
Alkaline Phosphatase: 82 U/L (ref 38–126)
Anion gap: 5 (ref 5–15)
BUN: 9 mg/dL (ref 6–20)
CO2: 27 mmol/L (ref 22–32)
Calcium: 8.6 mg/dL — ABNORMAL LOW (ref 8.9–10.3)
Chloride: 103 mmol/L (ref 98–111)
Creatinine, Ser: 0.83 mg/dL (ref 0.61–1.24)
GFR, Estimated: >60 mL/min (ref >60)
Glucose, Bld: 89 mg/dL (ref 70–99)
Potassium: 3.9 mmol/L (ref 3.5–5.1)
Sodium: 135 mmol/L (ref 135–145)
Total Bilirubin: 0.5 mg/dL (ref 0.3–1.2)
Total Protein: 8 g/dL (ref 6.5–8.1)

## 2021-05-28 LAB — LIPASE, BLOOD: Lipase: 140 U/L — ABNORMAL HIGH (ref 11–51)

## 2021-05-28 MED ORDER — ONDANSETRON 4 MG PO TBDP: 4.0000 mg | ORAL_TABLET | Freq: Three times a day (TID) | ORAL | 0 refills | Status: DC | PRN

## 2021-05-28 MED ORDER — IOHEXOL 300 MG/ML  SOLN
100.0000 mL | Freq: Once | INTRAMUSCULAR | Status: AC | PRN
  Administered 2021-05-28: 100 mL via INTRAVENOUS

## 2021-05-28 MED ORDER — DICYCLOMINE HCL 20 MG PO TABS: 20.0000 mg | ORAL_TABLET | Freq: Two times a day (BID) | ORAL | 0 refills | Status: DC

## 2021-05-28 MED ORDER — SODIUM CHLORIDE 0.9 % IV BOLUS
1000.0000 mL | Freq: Once | INTRAVENOUS | Status: AC
  Administered 2021-05-28: 1000 mL via INTRAVENOUS

## 2021-05-28 MED ORDER — PREDNISONE 10 MG PO TABS: ORAL_TABLET | ORAL | 0 refills | Status: AC

## 2021-05-28 NOTE — Discharge Instructions (Addendum)
Pancreatitis  Hydration: Symptoms will be intensified and complicated by dehydration. Dehydration can also extend the duration of symptoms. Drink plenty of fluids and get plenty of rest. You should be drinking at least half a liter of water an hour to stay hydrated. Electrolyte drinks (ex. Gatorade, Powerade, Pedialyte) are also encouraged. You should be drinking enough fluids to make your urine light yellow, almost clear. If this is not the case, you are not drinking enough water. Please note that some of the treatments indicated below will not be effective if you are not adequately hydrated. Diet: Please concentrate on hydration, however, you may introduce food slowly.  Start with a clear liquid diet, progressed to a full liquid diet, and then bland solids as you are able. Pain:  Antiinflammatory medications: Take 600 mg of ibuprofen every 6 hours or 440 mg (over the counter dose) to 500 mg (prescription dose) of naproxen every 12 hours for the next 3 days. After this time, these medications may be used as needed for pain. Take these medications with food to avoid upset stomach. Choose only one of these medications, do not take them together. Acetaminophen: Should you continue to have additional pain while taking the ibuprofen or naproxen, you may add in acetaminophen (generic for Tylenol) as needed. Your daily total maximum amount of acetaminophen from all sources should be limited to 4033m/day for persons without liver problems, or 20071mday for those with liver problems. Dicyclomine: Dicyclomine (generic for Bentyl) is what is known as an antispasmodic and is intended to help reduce abdominal discomfort. Prednisone: Take the prednisone, as prescribed, until finished. If you are a diabetic, please know prednisone can raise your blood sugar temporarily. Nausea/vomiting: Use the ondansetron (generic for Zofran) for nausea or vomiting.  This medication may not prevent all vomiting or nausea, but can  help facilitate better hydration. Things that can help with nausea/vomiting also include peppermint/menthol candies, vitamin B12, and ginger.  For prescription assistance, may try using prescription discount sites or apps, such as goodrx.com

## 2021-05-28 NOTE — ED Triage Notes (Signed)
Pain in left flank intermittent onset yesterday

## 2021-05-28 NOTE — ED Provider Notes (Signed)
[facility] Provider Note   CSN: [id number] Arrival date & time: 05/28/21  1328     History Chief Complaint  Patient presents with  . Flank Pain    Cory James is a 40 y.o. male.  The history is provided by the patient.      Cory James is a 40 y.o. male, with a history of benign brain tumor, ulcerative colitis, pancreatitis, presenting to the ED with abdominal pain beginning yesterday.  His pain is epigastric and left upper quadrant, cramping, waxing and waning, severe yesterday, mild at the time of my initial interview, radiating into the left flank. He states he had a similar presentation a few years ago with diagnosis of pancreatitis. He states he has diarrhea daily due to his ulcerative colitis.  He has not had any changes in his bowel movements since yesterday, however, he has noted some return of blood in his stools, consistent with previous ulcerative colitis flares. Occasional BC powder use.  Denies other NSAID use.  Occasional alcohol use.  Denies current tobacco use. Denies fever/chills, nausea/vomiting, melena, urinary symptoms, chest pain, shortness of breath, or any other complaints.   Past Medical History:  Diagnosis Date  . Acute midline thoracic back pain 12/26/2019  . Adult acne 12/26/2019  . Allergy    seasonal  . Brain tumor (benign) ([place])   . Colitis, ulcerative ([place])   . Pancreatitis     Patient Active Problem List   Diagnosis Date Noted  . Fatigue 03/26/2021  . Chronic nasal congestion 03/26/2021  . Snoring 03/26/2021  . Screening due 03/26/2021  . Gastroesophageal reflux disease 12/10/2020  . Environmental and seasonal allergies 12/10/2020  . Ulcerative colitis ([place]) 06/10/2020  . H/O brain tumor 03/25/2020  . Numbness 03/25/2020  . Chronic diarrhea 02/05/2020  . Irritable bowel syndrome with diarrhea 11/28/2019    Past Surgical History:  Procedure Laterality Date  . BRAIN SURGERY    . COLONOSCOPY WITH  PROPOFOL N/A 02/18/2020   Procedure: COLONOSCOPY WITH PROPOFOL;  Surgeon: Rogene Houston, MD;  Location: [facility];  Service: Endoscopy;  Laterality: N/A;       Family History  Problem Relation Age of Onset  . Hypertension Mother   . Diabetes Mother   . Healthy Father   . Healthy Sister     Social History   Tobacco Use  . Smoking status: Every Day    Packs/day: 0.50    Years: 18.00    Pack years: 9.00    Types: E-cigarettes, Cigarettes  . Smokeless tobacco: Never  . Tobacco comments:    stopped smoking Jan.2022  Vaping Use  . Vaping Use: Every day  . Substances: Nicotine  Substance Use Topics  . Alcohol use: Not Currently    Comment: rarely  . Drug use: Not Currently    Comment: occassion use    Home Medications Prior to Admission medications   Medication Sig Start Date End Date Taking? Authorizing Provider  dicyclomine (BENTYL) 20 MG tablet Take 1 tablet (20 mg total) by mouth 2 (two) times daily. 05/28/21  Yes Jahki Witham C, PA-C  ondansetron (ZOFRAN ODT) 4 MG disintegrating tablet Take 1 tablet (4 mg total) by mouth every 8 (eight) hours as needed for nausea or vomiting. 05/28/21  Yes Walterine Amodei C, PA-C  predniSONE (DELTASONE) 10 MG tablet Take 4 tablets (40 mg total) by mouth daily for 5 days, THEN 3 tablets (30 mg total) daily for 1 day, THEN 2 tablets (20 mg  total) daily for 1 day, THEN 1 tablet (10 mg total) daily for 1 day. 05/28/21 06/05/21 Yes Lakenya Riendeau C, PA-C  Aspirin-Salicylamide-Caffeine (BC FAST PAIN RELIEF) 650-195-33.3 MG PACK Take 1 packet by mouth daily as needed (headaches/pain.).    [provider]  famotidine (PEPCID) 20 MG tablet Take 1 tablet (20 mg total) by mouth 2 (two) times daily. 12/10/20   Perlie Mayo, NP  LACTOBACILLUS PROBIOTIC PO Take 1 capsule by mouth daily.    [provider]  loperamide (IMODIUM) 2 MG capsule Take 1 capsule (2 mg total) by mouth daily with breakfast. 12/09/20   Rehman, Mechele Dawley, MD  loratadine  (CLARITIN) 10 MG tablet Take 1 tablet (10 mg total) by mouth daily. 12/10/20   Perlie Mayo, NP  Mesalamine 800 MG TBEC Take 2 tablets (1,600 mg total) by mouth in the morning and at bedtime. 12/09/20   Rogene Houston, MD    Allergies    Patient has no known allergies.  Review of Systems   Review of Systems  Constitutional:  Negative for chills, diaphoresis and fever.  Respiratory:  Negative for cough and shortness of breath.   Cardiovascular:  Negative for chest pain.  Gastrointestinal:  Positive for abdominal pain. Negative for blood in stool, diarrhea, nausea and vomiting.  Genitourinary:  Negative for dysuria and hematuria.  Musculoskeletal:  Negative for back pain.  Neurological:  Negative for dizziness, syncope and weakness.  All other systems reviewed and are negative.  Physical Exam Updated Vital Signs BP 132/88   Pulse 75   Temp 98.2 F (36.8 C) (Oral)   Resp 18   SpO2 99%   Physical Exam Vitals and nursing note reviewed.  Constitutional:      General: He is not in acute distress.    Appearance: He is well-developed. He is not diaphoretic.  HENT:     Head: Normocephalic and atraumatic.     Mouth/Throat:     Mouth: Mucous membranes are moist.     Pharynx: Oropharynx is clear.  Eyes:     Conjunctiva/sclera: Conjunctivae normal.  Cardiovascular:     Rate and Rhythm: Normal rate and regular rhythm.     Pulses: Normal pulses.          Radial pulses are 2+ on the right side and 2+ on the left side.       Posterior tibial pulses are 2+ on the right side and 2+ on the left side.     Heart sounds: Normal heart sounds.  Pulmonary:     Effort: Pulmonary effort is normal. No respiratory distress.     Breath sounds: Normal breath sounds.  Abdominal:     Palpations: Abdomen is soft.     Tenderness: There is abdominal tenderness in the epigastric area and periumbilical area. There is no guarding.  Musculoskeletal:     Cervical back: Neck supple.     Right lower  leg: No edema.     Left lower leg: No edema.  Skin:    General: Skin is warm and dry.  Neurological:     Mental Status: He is alert.  Psychiatric:        Mood and Affect: Mood and affect normal.        Speech: Speech normal.        Behavior: Behavior normal.    ED Results / Procedures / Treatments   Labs (all labs ordered are listed, but only abnormal results are displayed) Labs Reviewed  CBC  WITH DIFFERENTIAL/PLATELET - Abnormal; Notable for the following components:      Result Value   WBC 12.3 (*)    MCH 25.7 (*)    Neutro Abs 8.2 (*)    Abs Immature Granulocytes 0.10 (*)    All other components within normal limits  COMPREHENSIVE METABOLIC PANEL - Abnormal; Notable for the following components:   Calcium 8.6 (*)    All other components within normal limits  URINALYSIS, ROUTINE W REFLEX MICROSCOPIC - Abnormal; Notable for the following components:   Hgb urine dipstick SMALL (*)    All other components within normal limits  LIPASE, BLOOD - Abnormal; Notable for the following components:   Lipase 140 (*)    All other components within normal limits    EKG None  Radiology CT ABDOMEN PELVIS W CONTRAST  Result Date: 05/28/2021 CLINICAL DATA:  Left flank pain x1 day. EXAM: CT ABDOMEN AND PELVIS WITH CONTRAST TECHNIQUE: Multidetector CT imaging of the abdomen and pelvis was performed using the standard protocol following bolus administration of intravenous contrast. CONTRAST:  143m OMNIPAQUE IOHEXOL 300 MG/ML  SOLN COMPARISON:  None. FINDINGS: Lower chest: No acute abnormality. Hepatobiliary: No focal liver abnormality is seen. No gallstones, gallbladder wall thickening, or biliary dilatation. Pancreas: Unremarkable. No pancreatic ductal dilatation or surrounding inflammatory changes. Spleen: Normal in size without focal abnormality. Adrenals/Urinary Tract: Adrenal glands are unremarkable. Kidneys are normal, without renal calculi, focal lesion, or hydronephrosis. Bladder is  unremarkable. Stomach/Bowel: Stomach is within normal limits. Appendix appears normal. No evidence of bowel dilatation. The proximal to mid sigmoid colon is mildly thickened and inflamed. Vascular/Lymphatic: No significant vascular findings are present. No enlarged abdominal or pelvic lymph nodes. Reproductive: Uterus and bilateral adnexa are unremarkable. Other: No abdominal wall hernia or abnormality. No abdominopelvic ascites. Musculoskeletal: No acute or significant osseous findings. IMPRESSION: Mild sigmoid colitis. Electronically Signed   By: TVirgina NorfolkM.D.   On: 05/28/2021 17:33    Procedures Procedures   Medications Ordered in ED Medications  iohexol (OMNIPAQUE) 300 MG/ML solution 100 mL (100 mLs Intravenous Contrast Given 05/28/21 1651)  sodium chloride 0.9 % bolus 1,000 mL (0 mLs Intravenous Stopped 05/28/21 1807)    ED Course  I have reviewed the triage vital signs and the nursing notes.  Pertinent labs & imaging results that were available during my care of the patient were reviewed by me and considered in my medical decision making (see chart for details).  Clinical Course as of 05/28/21 1833  Thu May 28, 2021  1755 CT ABDOMEN PELVIS W CONTRAST Spoke with Dr. HSherrye Payor radiologist.  States the finding of mild sigmoid colitis on the CT could be due to the patient's ulcerative colitis and given this history, this is likely the cause of this CT finding. [SJ]    Clinical Course User Index [SJ] Patriece Archbold, SHelane Gunther PA-C   MDM Rules/Calculators/A&P                          Patient presents with abdominal pain beginning yesterday. Patient is nontoxic appearing, afebrile, not tachycardic, not tachypneic, not hypotensive, maintains excellent SPO2 on room air, and is in no apparent distress.   I have reviewed the patient's chart to obtain more information.   I reviewed and interpreted the patient's labs and radiological studies. Mild leukocytosis.  Elevated lipase, suggesting a  degree of pancreatitis.  Bilirubin unremarkable.  Right-sided abdominal exam unremarkable as well.  Low suspicion for gallbladder source of  the patient's pain. Mild sigmoid colitis suspected to be more than likely due to possible flare of ulcerative colitis, rather than an infectious process. Patient appeared to be comfortable throughout his ED course.  He did not require any analgesia. The patient was given instructions for home care as well as return precautions. Patient voices understanding of these instructions, accepts the plan, and is comfortable with discharge.  Vitals:   05/28/21 1338 05/28/21 1546 05/28/21 1802  BP: (!) 136/99 132/88 134/85  Pulse: 71 75 72  Resp: 18 18 18   Temp: 98.7 F (37.1 C) 98.2 F (36.8 C) 98.3 F (36.8 C)  TempSrc: Oral Oral   SpO2: 100% 99% 99%      Final Clinical Impression(s) / ED Diagnoses Final diagnoses:  Acute pancreatitis, unspecified complication status, unspecified pancreatitis type  Colitis    Rx / DC Orders ED Discharge Orders          Ordered    predniSONE (DELTASONE) 10 MG tablet        05/28/21 1830    dicyclomine (BENTYL) 20 MG tablet  2 times daily        05/28/21 1830    ondansetron (ZOFRAN ODT) 4 MG disintegrating tablet  Every 8 hours PRN        05/28/21 1830             Lorayne Bender, PA-C 05/28/21 1836    Noemi Chapel, MD 05/30/21 1455

## 2021-05-28 NOTE — ED Notes (Signed)
Patient transported to CT 

## 2021-06-09 ENCOUNTER — Ambulatory Visit (INDEPENDENT_AMBULATORY_CARE_PROVIDER_SITE_OTHER): Payer: 59 | Admitting: Internal Medicine

## 2021-06-09 ENCOUNTER — Encounter (INDEPENDENT_AMBULATORY_CARE_PROVIDER_SITE_OTHER): Payer: Self-pay | Admitting: Internal Medicine

## 2021-06-09 ENCOUNTER — Other Ambulatory Visit: Payer: Self-pay

## 2021-06-09 VITALS — BP 123/80 | HR 69 | Temp 98.3°F | Ht 70.0 in | Wt 186.0 lb

## 2021-06-09 DIAGNOSIS — K51018 Ulcerative (chronic) pancolitis with other complication: Secondary | ICD-10-CM

## 2021-06-09 DIAGNOSIS — Z8719 Personal history of other diseases of the digestive system: Secondary | ICD-10-CM | POA: Diagnosis not present

## 2021-06-09 MED ORDER — PREDNISONE 10 MG PO TABS: 30.0000 mg | ORAL_TABLET | Freq: Every day | ORAL | 0 refills | Status: DC

## 2021-06-09 NOTE — Patient Instructions (Signed)
Please try to obtain records from hospital in [place] regarding evaluation for pancreatitis. Take prednisone 30 mg every morning for 1 week and thereafter drop dose by 5 mg every week Notify if you have another episode of abdominal or chest pain. Physician will call with results of blood and stool test.

## 2021-06-09 NOTE — Progress Notes (Signed)
Presenting complaint;  Follow-up for ulcerative colitis. Recent ER visit for left flank pain.  Patient told he has pancreatitis.  Database and subjective:  Patient is a 40 year old male who presented with chronic diarrhea and rectal bleeding in March 2020.  Stool studies were negative.  Colonoscopy revealed pan ulcerative colitis.  He was treated with prednisone and oral mesalamine.  Stool frequency has improved and rectal bleeding has decreased but symptoms have not subsided completely.  He was last seen in December 2021 when he mentioned he was having 6-7 stools per day.  I recommended fecal calprotectin. Patient states he got sick with COVID and therefore was not able to do stool study.  He he has received 2 doses of COVID-vaccine.  He is not sure when was the second dose.  He feels he has fully recovered.  Patient states his diarrhea has worsened.  He has anywhere from 7-10 stools per day.  Stools are mushy to loose.  He has diarrhea both during daytime as well as night.  He has occasional rectal bleeding is always small in amount.  He denies abdominal pain or cramping.  He has good appetite.  He is not sure why he has lost 12 pounds since his last visit. Patient states he is interested in therapy with Entyvio/vedolizumab. Patient says he was seen in emergency room 12 days ago for left flank and left-sided rib cage pain.  Lipase was 140(ULN51).  He had abdominal pelvic CT and there was no abnormality to pancreas.  Nevertheless he was told he had pancreatitis.  CT also reveals some thickening to sigmoid colon.  He was given Medrol Dosepak and dicyclomine.  He is use of these medications. Patient states he had an episode of pancreatitis back in 2019 when he was living in [place].  He did not have to be hospitalized but he did have negative work-up.  He believes he says those records at home.  Patient states since this episode he has decided not to drink alcohol anymore.  He recalls he did  not have any prior to onset of his abdominal/left flank pain.  He takes BC powder no more than 2-4 times a month for headache. He remains under a lot of stress.  He works 10 to 12 hours every day.  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   Current Medications: Outpatient Encounter Medications as of 06/09/2021  Medication Sig   Aspirin-Salicylamide-Caffeine (BC FAST PAIN RELIEF) 650-195-33.3 MG PACK Take 1 packet by mouth daily as needed (headaches/pain.).   Mesalamine 800 MG TBEC Take 2 tablets (1,600 mg total) by mouth in the morning and at bedtime.   [DISCONTINUED] dicyclomine (BENTYL) 20 MG tablet Take 1 tablet (20 mg total) by mouth 2 (two) times daily. (Patient not taking: Reported on 06/09/2021)   [DISCONTINUED] famotidine (PEPCID) 20 MG tablet Take 1 tablet (20 mg total) by mouth 2 (two) times daily. (Patient not taking: Reported on 06/09/2021)   [DISCONTINUED] LACTOBACILLUS PROBIOTIC PO Take 1 capsule by mouth daily. (Patient not taking: Reported on 06/09/2021)   [DISCONTINUED] loperamide (IMODIUM) 2 MG capsule Take 1 capsule (2 mg total) by mouth daily with breakfast.   [DISCONTINUED] loratadine (CLARITIN) 10 MG tablet Take 1 tablet (10 mg total) by mouth daily.   [DISCONTINUED] ondansetron (ZOFRAN ODT) 4 MG disintegrating tablet Take 1 tablet (4 mg total) by mouth every 8 (eight) hours as needed for nausea or vomiting.   No facility-administered encounter medications on file as of 06/09/2021.  Objective: Blood pressure 123/80, pulse 69, temperature 98.3 F (36.8 C), temperature source Oral, height _0  (1.778 m), weight 186 lb (84.4 kg). Patient is alert and in no acute distress. He is wearing a mask. Conjunctiva is pink. Sclera is nonicteric Oropharyngeal mucosa is normal. No neck masses or thyromegaly noted. Cardiac exam with regular rhythm normal S1 and S2. No murmur or gallop noted. Lungs are clear to auscultation. Abdomen is symmetrical.  Bowel sounds are normal.  On  palpation abdomen is soft and nontender with organomegaly or masses. No LE edema or clubbing noted.  Labs/studies Results:   CBC Latest Ref Rng & Units 05/28/2021 02/18/2020 02/05/2019  WBC 4.0 - 10.5 K/uL 12.3(H) 9.1 11.3(H)  Hemoglobin 13.0 - 17.0 g/dL 13.5 13.3 11.9(L)  Hematocrit 39.0 - 52.0 % 42.8 43.1 36.1(L)  Platelets 150 - 400 K/uL 251 284 316    CMP Latest Ref Rng & Units 05/28/2021 02/18/2020 02/05/2019  Glucose 70 - 99 mg/dL 89 86 117  BUN 6 - 20 mg/dL _1 Creatinine 0.61 - 1.24 mg/dL 0.83 1.12 0.91  Sodium 135 - 145 mmol/L 135 140 140  Potassium 3.5 - 5.1 mmol/L 3.9 5.0 4.5  Chloride 98 - 111 mmol/L 103 105 107  CO2 22 - 32 mmol/L _2 Calcium 8.9 - 10.3 mg/dL 8.6(L) 9.4 8.7  Total Protein 6.5 - 8.1 g/dL 8.0 8.2(H) 7.0  Total Bilirubin 0.3 - 1.2 mg/dL 0.5 1.1 0.3  Alkaline Phos 38 - 126 U/L 82 86 -  AST 15 - 41 U/L _3 ALT 0 - 44 U/L _4 Hepatic Function Latest Ref Rng & Units 05/28/2021 02/18/2020 02/05/2019  Total Protein 6.5 - 8.1 g/dL 8.0 8.2(H) 7.0  Albumin 3.5 - 5.0 g/dL 3.8 3.8 -  AST 15 - 41 U/L _5 ALT 0 - 44 U/L _6 Alk Phosphatase 38 - 126 U/L 82 86 -  Total Bilirubin 0.3 - 1.2 mg/dL 0.5 1.1 0.3    Lab Results  Component Value Date   CRP 0.6 02/18/2020    Abdominal pelvic CT from 05/28/2021 reviewed. Pancreas is normal.  No gallstones.  Bile duct is nondilated. Mild wall thickening noted to sigmoid colon.  Assessment:  #1.  Pan ulcerative colitis.  He has not responded to oral mesalamine at a dose of 3200 mg/day.  He has responded to prednisone only use as a bridge to maintenance therapy.  He is still having multiple bowel movements per day.  Luckily he does not appear to be acute DL.  He would be candidate for biologic therapy.  #2.  History of pancreatitis.  He had an episode in 2019 as above.  Recent episode of left left flank abdominal pain thought to be due to pancreatitis but CT did not reveal any evidence of  pancreatitis.  His lipase was less than threefold elevated.  It does not appear that recent episode was due to pancreatitis or he could have mild episode.  He does not have any risk factors.   Plan:  Patient will bring records from 2019 regarding his evaluation pancreatitis and 2019. Patient will call office if abdominal pain recurs in which case would consider further evaluation.  If indeed he had an episode of pancreatitis in 2019 would consider further evaluation. Continue Asacol at present dose. Prednisone 30 mg p.o. every morning for 1 week and drop dose by 5 mg every week. Patient will  try to obtain stool specimen for fecal calprotectin. Patient will go to the lab for QuantiFERON TB Gold plus, hepatitis B surface antigen and antibody. Will send request for Entyvio/vedolizumab to patient's pharmacy when lab studies are completed. Office visit in 3 months.

## 2021-06-12 ENCOUNTER — Other Ambulatory Visit (INDEPENDENT_AMBULATORY_CARE_PROVIDER_SITE_OTHER): Payer: Self-pay | Admitting: Internal Medicine

## 2021-06-15 LAB — QUANTIFERON-TB GOLD PLUS
Mitogen-NIL: 7.83 IU/mL
NIL: 0.02 IU/mL
QuantiFERON-TB Gold Plus: NEGATIVE
TB1-NIL: 0 IU/mL
TB2-NIL: 0 IU/mL

## 2021-06-15 LAB — HEPATITIS B SURFACE ANTIGEN: Hepatitis B Surface Ag: NONREACTIVE

## 2021-06-15 LAB — HEPATITIS B SURFACE ANTIBODY,QUALITATIVE: Hep B S Ab: REACTIVE — AB

## 2021-06-16 ENCOUNTER — Other Ambulatory Visit (INDEPENDENT_AMBULATORY_CARE_PROVIDER_SITE_OTHER): Payer: Self-pay

## 2021-06-18 LAB — CALPROTECTIN: Calprotectin: 367 mcg/g — ABNORMAL HIGH

## 2021-06-24 ENCOUNTER — Ambulatory Visit: Payer: 59 | Admitting: Nurse Practitioner

## 2021-07-09 ENCOUNTER — Telehealth (INDEPENDENT_AMBULATORY_CARE_PROVIDER_SITE_OTHER): Payer: Self-pay

## 2021-07-09 ENCOUNTER — Other Ambulatory Visit: Payer: Self-pay

## 2021-07-09 ENCOUNTER — Encounter (HOSPITAL_COMMUNITY)
Admission: RE | Admit: 2021-07-09 | Discharge: 2021-07-09 | Disposition: A | Discharge status: Home / Self Care | Payer: 59 | Source: Ambulatory Visit | Attending: Internal Medicine | Admitting: Internal Medicine

## 2021-07-09 DIAGNOSIS — K5190 Ulcerative colitis, unspecified, without complications: Secondary | ICD-10-CM | POA: Insufficient documentation

## 2021-07-09 MED ORDER — VEDOLIZUMAB 300 MG IV SOLR
300.0000 mg | Freq: Once | INTRAVENOUS | Status: AC
  Administered 2021-07-09: 300 mg via INTRAVENOUS
  Filled 2021-07-09: qty 5

## 2021-07-09 MED ORDER — SODIUM CHLORIDE 0.9 % IV SOLN: Freq: Once | INTRAVENOUS | Status: AC

## 2021-07-09 NOTE — Telephone Encounter (Signed)
Patient called today was his first infusion of Entyvio at short stay center. He states he is taking prednisone and mesalamine does he need to continue these? Please advise.

## 2021-07-10 NOTE — Telephone Encounter (Signed)
I called and left a message asked the patient to return call.   Per Dr.Rehman continue Mesalamine and taper the prednisone as told before by 5 mg Q week.

## 2021-07-14 NOTE — Telephone Encounter (Signed)
Patient aware.

## 2021-07-23 ENCOUNTER — Other Ambulatory Visit: Payer: Self-pay

## 2021-07-23 ENCOUNTER — Encounter (HOSPITAL_COMMUNITY)
Admission: RE | Admit: 2021-07-23 | Discharge: 2021-07-23 | Disposition: A | Discharge status: Home / Self Care | Payer: 59 | Source: Ambulatory Visit | Attending: Internal Medicine | Admitting: Internal Medicine

## 2021-07-23 ENCOUNTER — Encounter (HOSPITAL_COMMUNITY): Payer: Self-pay

## 2021-07-23 DIAGNOSIS — K5190 Ulcerative colitis, unspecified, without complications: Secondary | ICD-10-CM | POA: Insufficient documentation

## 2021-07-23 MED ORDER — SODIUM CHLORIDE 0.9 % IV SOLN: Freq: Once | INTRAVENOUS | Status: AC

## 2021-07-23 MED ORDER — VEDOLIZUMAB 300 MG IV SOLR
300.0000 mg | Freq: Once | INTRAVENOUS | Status: AC
  Administered 2021-07-23: 300 mg via INTRAVENOUS
  Filled 2021-07-23: qty 5

## 2021-07-24 ENCOUNTER — Telehealth (INDEPENDENT_AMBULATORY_CARE_PROVIDER_SITE_OTHER): Payer: Self-pay

## 2021-07-24 NOTE — Progress Notes (Signed)
Pt notified of appt change to 08/20/2021 at 0800. Voiced understanding.

## 2021-07-24 NOTE — Telephone Encounter (Signed)
Patient scheduled at [facility] stay center for Entyvio at 0,2,6 weeks. Patient had Week 0 done on 07/09/2021, Week 2 done on 07/23/2021, and Week 6 to be done around 08/20/2021. When I check Epic today to see when Week 6 was schedule short stay had the patient scheduled too early at 08/06/2021. I called [facility] and spoke with a nurse Becky whom states she will change the appointment date and time. I have called the patient and advised not to go on 08/06/2021 as this was too early. I advised that Short stay says the are going to call him to reschedule,but if he does not hear from them to call and reschedule around 08/20/2021. Patient states understanding.

## 2021-08-06 ENCOUNTER — Inpatient Hospital Stay (HOSPITAL_COMMUNITY): Admission: RE | Admit: 2021-08-06 | Payer: 59 | Source: Ambulatory Visit

## 2021-08-20 ENCOUNTER — Encounter (HOSPITAL_COMMUNITY)
Admission: RE | Admit: 2021-08-20 | Discharge: 2021-08-20 | Disposition: A | Discharge status: Home / Self Care | Payer: 59 | Source: Ambulatory Visit | Attending: Internal Medicine | Admitting: Internal Medicine

## 2021-08-21 ENCOUNTER — Telehealth (INDEPENDENT_AMBULATORY_CARE_PROVIDER_SITE_OTHER): Payer: Self-pay

## 2021-08-21 NOTE — Telephone Encounter (Signed)
Patient last entyvio induction was scheduled for 08/20/2021 at 8 am . Short stay had him listed as a No show. I called the patient and asked if he went he said he did but thought the appt was at 8:15 am and they would not see him. They have not rescheduled his appointment. I asked that he please call and have that rescheduled ASAP. He states he did not have the time to rescheduled it at this time. I advised to call them and reschedule and let me know what the date and time are.

## 2021-08-31 NOTE — Telephone Encounter (Signed)
I called the patient left message on machine to find out if he has rescheduled this as I left him a message that we would have to restart this process if not done in a timely fashion. I asked that the patient please return call.

## 2021-09-01 NOTE — Telephone Encounter (Signed)
I made Dr. Laural Golden aware of the patient not receiving his last induction dose of Entyvio.

## 2021-11-24 ENCOUNTER — Telehealth (INDEPENDENT_AMBULATORY_CARE_PROVIDER_SITE_OTHER): Payer: Self-pay | Admitting: *Deleted

## 2021-11-24 NOTE — Telephone Encounter (Signed)
Pt last seen June for UC. States he is having a flare up and asking if prednisone could be called in. Flare up started 7 days ago. Has 10 -13 stools a day, has seen some blood in stool but not every day. Maybe 4 or 5 days out of the 7 days. No fever, some abdominal pain at times but not much.   [facility] [address]   [phone]

## 2021-11-24 NOTE — Telephone Encounter (Signed)
Called pt and made an appt for 12/8.

## 2021-11-26 ENCOUNTER — Other Ambulatory Visit (INDEPENDENT_AMBULATORY_CARE_PROVIDER_SITE_OTHER): Payer: Self-pay

## 2021-11-26 ENCOUNTER — Other Ambulatory Visit: Payer: Self-pay

## 2021-11-26 ENCOUNTER — Encounter (INDEPENDENT_AMBULATORY_CARE_PROVIDER_SITE_OTHER): Payer: Self-pay | Admitting: Gastroenterology

## 2021-11-26 ENCOUNTER — Ambulatory Visit (INDEPENDENT_AMBULATORY_CARE_PROVIDER_SITE_OTHER): Payer: 59 | Admitting: Gastroenterology

## 2021-11-26 VITALS — BP 129/91 | HR 72 | Temp 98.5°F | Ht 70.0 in | Wt 190.1 lb

## 2021-11-26 DIAGNOSIS — K625 Hemorrhage of anus and rectum: Secondary | ICD-10-CM | POA: Diagnosis not present

## 2021-11-26 DIAGNOSIS — R197 Diarrhea, unspecified: Secondary | ICD-10-CM

## 2021-11-26 DIAGNOSIS — K5100 Ulcerative (chronic) pancolitis without complications: Secondary | ICD-10-CM

## 2021-11-26 NOTE — Patient Instructions (Signed)
You can take your lab forms and go to [facility] across the street, that way you can provide stool sample today which will give [place] the results a little sooner. I will be in touch with you once we have the results. If no infection, I will send you a prednisone taper. If your rectal bleeding, abdominal pain or diarrhea worsens, you start feeling weak, dizzy or pass out, you need to proceed to the ER.  I am providing names of other treatment options for you UC so that you can discuss these at your visit with Dr. Laural Golden. Please look these medications up online so that you can get a better idea of which one you may be interested in discussing further, if you are not able to do the Entyvio due to time constraints. Please keep your follow up appt with Dr. Laural Golden in January.  Humira Rinvoq Remicade H[facility] [employer]

## 2021-11-26 NOTE — Progress Notes (Signed)
Referring Provider: Noreene Larsson, NP Primary Care Physician:  Noreene Larsson, NP Primary GI Physician: Laural Golden  Chief Complaint  Patient presents with   Ulcerative Colitis    Patient here today due to a UC flare. Had been prescribed Entyvio at [facility], but states he was late once and they cancelled him, and he did not go back. He states he was fine up until two days after Thanksgiving and the diarrhea started with some bright red blood seen.   HPI:   Cory James is a 40 y.o. male with past medical history of pan ulcerative colitis, benign brain tumor and pancreatitis.    Patient presenting today for suspected UC flare.  Patient was diagnosed with pan ulcerative colitis in March 2020 when he presented with chronic diarrhea and rectal bleeding, stool studies negative, he underwent colonoscopy which revealed moderately active pan colitis ulcerative colitis.   He was treated with oral mesalamine and prednisone. Stool frequency improved and rectal bleeding decreased, however, he was seen again in December 2021 with increased in BM frequency to 6-7 stools per day. Fecal calprotectin was ordered, but not completed as he got covid. He was last seen in June 2022 with reports of worsening diarrhea, 7-10 episodes per day that were mushy and loose and occasional rectal bleeding. He was on a course of dicyclomine and Medrol Dosepak just prior to his visit in June after presenting to the ED 12 days prior with L rib and L flank pain, CT without evidence of pancreatitis, however, he was told he had pancreatitis, and CT showed thickening to sigmoid colon. He was started on prednisone taper 06/09/21, fecal calprotectin (367) was ordered and process to start Entyvio was started.  First infusion of Entyvio was 07/09/21. He was continued on mesalamine at that time. He received his first 2 doses, however, he misunderstood the time for his 3rd dose and arrived late, therefore he was told they could not do his  infusion that day, he was unable to have this rescheduled, last dose of Entyvio was August 4th. He reports that when they turned him away for his third Entyvio dose it "broke his heart" as he felt he was doing so well on the medication and had not been late to any prior appointments. He states that shortly after starting entyvio he was having maybe 3-4 stools per day, still soft/looser in consistency but much improved and rectal bleeding had resolved. He felt after that incident that he would not be able to continue with the infusion regimen due to his schedule time restraints. He has not yet received his 3rd dose of Entyvio as of today.   He presents today with concerns of UC flare that began approx 13 days ago, having 12-13 stools per day with some rectal bleeding occurring maybe 4-5 out of 7 days. He reports that if he eats spicy foods, rectal bleeding seems to be worse. He is avoiding eating so that he does not have to run to restroom at work. He reports that abdominal pain is minimal. He denies any fevers, but thinks he has had some chills. He has not had any nausea or vomiting. No recent sick contacts or courses of antibiotics. He is still on mesalamine everyday but this has not provided any relief. 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 He is requesting prednisone to help with his symptoms.he has had no episodes of dizziness, lightheadedness, or shortness of breath.   Last Colonoscopy:02/18/20- The examined portion of the ileum was normal. - Moderately active (Mayo Score 2) pancolitis ulcerative colitis, new since the last examination. Biopsied. - Stool specimen sent to lab for GI Pathogens, Last Endoscopy:n/a  Past Medical History:  Diagnosis Date   Acute midline thoracic back pain 12/26/2019   Adult acne  12/26/2019   Allergy    seasonal   Brain tumor (benign) ([place])    Colitis, ulcerative ([place])    Pancreatitis     Past Surgical History:  Procedure Laterality Date   BRAIN SURGERY     COLONOSCOPY WITH PROPOFOL N/A 02/18/2020   Procedure: COLONOSCOPY WITH PROPOFOL;  Surgeon: Rogene Houston, MD;  Location: [facility];  Service: Endoscopy;  Laterality: N/A;    Current Outpatient Medications  Medication Sig Dispense Refill   Aspirin-Salicylamide-Caffeine (BC FAST PAIN RELIEF) 650-195-33.3 MG PACK Take 1 packet by mouth daily as needed (headaches/pain.).     Mesalamine 800 MG TBEC Take 2 tablets (1,600 mg total) by mouth in the morning and at bedtime. 360 tablet 3   No current facility-administered medications for this visit.    Allergies as of 11/26/2021   (No Known Allergies)    Family History  Problem Relation Age of Onset   Hypertension Mother    Diabetes Mother    Healthy Father    Healthy Sister     Social History   Socioeconomic History   Marital status: Married    Spouse name: [name]   Number of children: 2   Years of education: 16   Highest education level: GED or equivalent  Occupational History   Not on file  Tobacco Use   Smoking status: Some Days    Packs/day: 0.50    Years: 18.00    Pack years: 9.00    Types: E-cigarettes, Cigarettes    Last attempt to quit: 12/2020    Years since quitting: 0.9   Smokeless tobacco: Never  Vaping Use   Vaping Use: Every day   Last attempt to quit: 12/20/2020   Substances: Nicotine  Substance and Sexual Activity   Alcohol use: Not Currently    Comment: rarely   Drug use: Not Currently    Comment: occassion use   Sexual activity: Yes  Other Topics Concern   Not on file  Social History Narrative   Married for 10 years to [place]      Two Children:   2020      Vansh 62 years old      Amanda 44 years old      No pets    Social Determinants of Health   Financial Resource Strain: Not on file  Food Insecurity:  Not on file  Transportation Needs: Not on file  Physical Activity: Not on file  Stress: Not on file  Social Connections: Not on file   Review of systems General: negative for malaise, night sweats, fever, weight loss +chills Neck: Negative for lumps, goiter, pain and significant neck swelling Resp: Negative for cough, wheezing, dyspnea at rest CV: Negative for chest pain, leg swelling, palpitations, orthopnea GI: denies melena, nausea, vomiting, constipation, dysphagia, odyonophagia, early satiety or unintentional weight loss. +diarrhea +hematochezia MSK: Negative for joint pain or swelling, back pain, and muscle pain. Derm: Negative for itching or rash Psych: Denies depression, anxiety, memory loss,  confusion. No homicidal or suicidal ideation.  Heme: Negative for prolonged bleeding, bruising easily, and swollen nodes. Endocrine: Negative for cold or heat intolerance, polyuria, polydipsia and goiter. Neuro: negative for tremor, gait imbalance, syncope and seizures. The remainder of the review of systems is noncontributory.  Physical Exam: BP (!) 129/91 (BP Location: Right Arm, Patient Position: Sitting, Cuff Size: Large)   Pulse 72   Temp 98.5 F (36.9 C) (Oral)   Ht 5' 10"  (1.778 m)   Wt 190 lb 1.6 oz (86.2 kg)   BMI 27.28 kg/m  General:   Alert and oriented. No distress noted. Pleasant and cooperative.  Head:  Normocephalic and atraumatic. Eyes:  Conjuctiva clear without scleral icterus. Mouth:  Oral mucosa pink and moist. Good dentition. No lesions. Heart: Normal rate and rhythm, s1 and s2 heart sounds present.  Lungs: Clear lung sounds in all lobes. Respirations equal and unlabored. Abdomen:  +BS, soft, non-tender and non-distended. No rebound or guarding. No HSM or masses noted. Derm: No palmar erythema or jaundice Msk:  Symmetrical without gross deformities. Normal posture. Extremities:  Without edema. Neurologic:  Alert and  oriented x4 Psych:  Alert and cooperative.  Normal mood and affect.  Invalid input(s): 6 MONTHS   ASSESSMENT: Lakota Markgraf is a 40 y.o. male presenting today with worsening diarrhea and rectal bleeding.  He was doing well after his initial dose of Entyvio in July, he had second dose in August but was unable to complete his third infusion after arriving late and being turned away. States his symptoms were well controlled after the initiation of Entyvio with continued mesalamine use until the Saturday after thanksgiving, when he began having 12-13 episodes of diarrhea per day with rectal bleeding occurring about 4-5 days per week. Patient feels sure he is having a flare of his UC and I agree this is likely the cause of his symptoms given the gap in treatment he has had with his Entyvio, however, I discussed the need to check stool studies and rule out superimposed concomitant infection prior to starting Prednisone. Patient was not amenable to waiting for prednisone prescription and advised that he could not continue with the Entyvio treatments due to time constraints with his schedule. I discussed the indications of stool testing and risks associated with prescribing prednisone if an underlying infection was present. Patient still insisted that he needed to go ahead and be prescribed a course of prednisone, due to the severity of his symptoms.   I discussed the case with Dr. Jenetta Downer, including patient's overall concerns regarding steroids for suspected flare and ongoing UC treatment. Dr. Jenetta Downer reviewed patient's chart and had a thorough discussion with the patient regarding why stool studies are indicated and safety concerns of starting steroids prior to ruling our underlying infectious etiology of diarrhea as well as limiting use of prednisone as much as possible given the side effects that are likely to occur with ongoing courses of steroids such as increased in blood pressure, increase in blood sugar, increased risk of bone damage and  continued compromise of the immune system.   He also discussed with patient the importance of treatment compliance for UC and deciding on a treatment that we as clinicians deem appropriate for patient and his disease, that he is also going to be able to remain compliant with. 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 I provided patient with some alternative options to [facility] for him to read over. Dr Jenetta Downer and I discussed that Ozanimod may be a good option for the patient as it is an oral medication that requires no infusion. Patient is interested in this, but will look over other options that were provided as well. If he is ultimately interested in [place], he will need a baseline CBC and EKG, given no previous history of Uveitis, an eye exam would not be required prior to induction of this medication. Patient was also made aware that other medications started now that he has already been initiated on Entyvio previously, may not provide the best results in regards to management of his UC since they were not the primary medication that he was started on after Mesalamine.   Ultimately, the final decision regarding treatment of UC will need to be agreed upon by the patient and Dr. Laural Golden as he is the main provider overseeing patient's care. We will check stool studies, if these are negative, prednisone taper will be sent in for patient until next OV when final disease treatment plan is further discussed and decided upon.   Patient verbalized understanding of all of the above, he is agreeable to this plan. We will have him complete stool studies at [facility] lab to hopefully speed up return of results so that we can start him on prednisone as soon as possible if no infectious etiology is present. He is  aware that if worsening diarrhea, rectal bleeding, vomiting, s/s of symptomatic anemia or or severe abdominal pain occur, he should proceed to the ER.   PLAN:  C diff, GI path panel 2. Will Rx Prednisone taper if stool studies negative 3. Need to discuss other treatment options for UC with Dr. Laural Golden at next upcoming OV  Follow Up: Has follow up 01/05/22  Nickolai Rinks L. Alver Sorrow, MSN, APRN, AGNP-C Adult-Gerontology Nurse Practitioner [facility]

## 2021-11-30 ENCOUNTER — Other Ambulatory Visit (INDEPENDENT_AMBULATORY_CARE_PROVIDER_SITE_OTHER): Payer: Self-pay

## 2021-11-30 DIAGNOSIS — R197 Diarrhea, unspecified: Secondary | ICD-10-CM

## 2021-12-01 ENCOUNTER — Other Ambulatory Visit (INDEPENDENT_AMBULATORY_CARE_PROVIDER_SITE_OTHER): Payer: Self-pay

## 2021-12-01 DIAGNOSIS — R197 Diarrhea, unspecified: Secondary | ICD-10-CM

## 2022-01-05 ENCOUNTER — Encounter (INDEPENDENT_AMBULATORY_CARE_PROVIDER_SITE_OTHER): Payer: Self-pay | Admitting: Internal Medicine

## 2022-01-05 ENCOUNTER — Other Ambulatory Visit: Payer: Self-pay

## 2022-01-05 ENCOUNTER — Ambulatory Visit (INDEPENDENT_AMBULATORY_CARE_PROVIDER_SITE_OTHER): Payer: 59 | Admitting: Internal Medicine

## 2022-01-05 VITALS — BP 123/83 | HR 81 | Temp 98.9°F | Ht 70.0 in | Wt 191.8 lb

## 2022-01-05 DIAGNOSIS — K5120 Ulcerative (chronic) proctitis without complications: Secondary | ICD-10-CM

## 2022-01-05 MED ORDER — MESALAMINE 800 MG PO TBEC: 1600.0000 mg | DELAYED_RELEASE_TABLET | Freq: Two times a day (BID) | ORAL | 3 refills | Status: DC

## 2022-01-05 NOTE — Patient Instructions (Signed)
We will start you on Rinvoq/upadacitinib when medication approved by insurance. In the meantime can trial probiotic VLS #3 three capsules 3 times a day

## 2022-01-05 NOTE — Progress Notes (Signed)
Presenting complaint;  Follow-up for ulcerative colitis.  Database and subjective:  Patient is 41 year old male who has a history of pan ulcerative colitis and is here for his scheduled visit.  He was last seen by Ms. Scherrie Gerlach, NP on 11/26/2021 for flareup and treated with short course of prednisone.  Patient was diagnosed with pan ulcerative colitis in March 2020.  He presented with over 3 months history of diarrhea and rectal bleeding.  Stool studies are negative and colonoscopy revealed pan ulcerative colitis.  Patient was treated with oral mesalamine and prednisone and improved.  His diarrhea worsened on prednisone tapering.  Biologic therapy was considered and he wanted to proceed with Entyvio/vedolizumab.  He received 2 doses and noted decrease in diarrhea.  Patient received first dose in July and second dose in August 2022.  He went for third dose on 08/20/2021.  He was late for 15 minutes and he was advised to come back the next day but he was not able to do it because of his work schedule.  However he would prefer not to go back on this medication because of his work schedule.  He is very much interested in trying oral therapy which is now available. He is having anywhere from 6-10 stools per day.  He has not passed any blood since his last visit.  He is having 1-2 bowel movements every night.  Previously was sporadic.  Patient states he would like to try one of the oral medications as it is hard for him to go to the clinic for infusion therapy. He takes BC powder for headache no more than 2-3 times in a month. He works 7 days a week.  Current Medications: Outpatient Encounter Medications as of 01/05/2022  Medication Sig   Aspirin-Salicylamide-Caffeine (BC FAST PAIN RELIEF) 650-195-33.3 MG PACK Take 1 packet by mouth daily as needed (headaches/pain.).   Mesalamine 800 MG TBEC Take 2 tablets (1,600 mg total) by mouth in the morning and at bedtime.   No facility-administered encounter  medications on file as of 01/05/2022.     Objective: Blood pressure 123/83, pulse 81, temperature 98.9 F (37.2 C), temperature source Oral, height 5' 10"  (1.778 m), weight 191 lb 12.8 oz (87 kg). Patient is alert and in no acute distress Conjunctiva is pink. Sclera is nonicteric Oropharyngeal mucosa is normal. No neck masses or thyromegaly noted. Cardiac exam with regular rhythm normal S1 and S2. No murmur or gallop noted. Lungs are clear to auscultation. Abdomen is symmetrical soft and nontender with organomegaly or masses. No LE edema or clubbing noted.  Labs/studies Results:   CBC Latest Ref Rng & Units 05/28/2021 02/18/2020 02/05/2019  WBC 4.0 - 10.5 K/uL 12.3(H) 9.1 11.3(H)  Hemoglobin 13.0 - 17.0 g/dL 13.5 13.3 11.9(L)  Hematocrit 39.0 - 52.0 % 42.8 43.1 36.1(L)  Platelets 150 - 400 K/uL 251 284 316    CMP Latest Ref Rng & Units 05/28/2021 02/18/2020 02/05/2019  Glucose 70 - 99 mg/dL 89 86 117  BUN 6 - 20 mg/dL 9 12 10   Creatinine 0.61 - 1.24 mg/dL 0.83 1.12 0.91  Sodium 135 - 145 mmol/L 135 140 140  Potassium 3.5 - 5.1 mmol/L 3.9 5.0 4.5  Chloride 98 - 111 mmol/L 103 105 107  CO2 22 - 32 mmol/L 27 28 26   Calcium 8.9 - 10.3 mg/dL 8.6(L) 9.4 8.7  Total Protein 6.5 - 8.1 g/dL 8.0 8.2(H) 7.0  Total Bilirubin 0.3 - 1.2 mg/dL 0.5 1.1 0.3  Alkaline Phos 38 -  126 U/L 82 86 -  AST 15 - 41 U/L 16 18 17   ALT 0 - 44 U/L 21 22 25     Hepatic Function Latest Ref Rng & Units 05/28/2021 02/18/2020 02/05/2019  Total Protein 6.5 - 8.1 g/dL 8.0 8.2(H) 7.0  Albumin 3.5 - 5.0 g/dL 3.8 3.8 -  AST 15 - 41 U/L 16 18 17   ALT 0 - 44 U/L 21 22 25   Alk Phosphatase 38 - 126 U/L 82 86 -  Total Bilirubin 0.3 - 1.2 mg/dL 0.5 1.1 0.3    Lab Results  Component Value Date   CRP 0.6 02/18/2020    QuantiFERON-TB gold plus was negative on 06/12/2021 Hepatitis B surface antigen negative on 06/12/2021 Hepatitis B surface antibody positive implying immunity also on 06/12/2021 Fecal calprotectin was 367 on  06/12/2021  Assessment:  #1.  Pan ulcerative colitis for almost 2 years duration.  Mesalamine alone has been ineffective in inducing remission.  Entyvio/vedolizumab induction therapy was not completed with 2 doses appear to be helping symptoms.  Patient interested in oral medications as he will have difficult time arranging for periodic infusions. Therefore we will start him on Rinvoq/upadacitinib as soon as this therapy is approved by his insurance.  In the meantime we will continue oral mesalamine.  If JAK works we will discontinue mesalamine. Patient is immune to hepatitis B and TB   Plan:  Continue oral mesalamine at a dose of 1600 mg by mouth twice daily. Request for Rinvoq/upadacitinib for induction and maintenance therapy to be sent to patient's pharmacy. Office visit in 3 months.

## 2022-01-14 ENCOUNTER — Telehealth (INDEPENDENT_AMBULATORY_CARE_PROVIDER_SITE_OTHER): Payer: Self-pay

## 2022-01-14 ENCOUNTER — Other Ambulatory Visit (INDEPENDENT_AMBULATORY_CARE_PROVIDER_SITE_OTHER): Payer: Self-pay

## 2022-01-14 DIAGNOSIS — K5120 Ulcerative (chronic) proctitis without complications: Secondary | ICD-10-CM

## 2022-01-14 DIAGNOSIS — K51911 Ulcerative colitis, unspecified with rectal bleeding: Secondary | ICD-10-CM

## 2022-01-14 DIAGNOSIS — K5100 Ulcerative (chronic) pancolitis without complications: Secondary | ICD-10-CM

## 2022-01-14 MED ORDER — RINVOQ 45 MG PO TB24: 45.0000 mg | ORAL_TABLET | Freq: Every day | ORAL | 1 refills | Status: DC

## 2022-01-14 NOTE — Telephone Encounter (Signed)
I made [place] access specialist aware via vm that the patient was approved.

## 2022-01-14 NOTE — Telephone Encounter (Signed)
Patient was approved for his Rinvoq 45 mg once per day for eight (8) weeks, And approved for Rinvoq 30 mg maintenance dose from 01/13/2022 - 01/13/2023. I have submitted the Rx for the starter dose and will await a follow up visit with Dr. To order the maintenance dose.

## 2022-01-15 NOTE — Telephone Encounter (Signed)
I spoke with the patient made him aware he had been approved and that he should be hearing from [facility] to set up shipment. I gave him the phone number incase he did not hear from them to call. He will call me when he receives the medication so we can set up a four week follow up  after starting.

## 2022-01-19 NOTE — Telephone Encounter (Signed)
Called pharm and got through this time. She states there is no auth needed and she is showing a paid claim. Asked that patient call pharm to set up delivery and confirm address. I called and let pt know this and to call me back if any problems.

## 2022-01-19 NOTE — Telephone Encounter (Signed)
Patient states pharmacy is waiting for dr approval of rinvoq. Approval has been done and note was sent in with rx tha approval was done and good through 01/13/22. I called pharm 4 times and kept getting hung up on and I was told they are having phone difficulties today. I faxed a note along with approval letter to pharm. I called and let pt know that I could not get through to pharmacy even though he was able to this morning and let him know I would keep trying and I faxed the approval letter to them.

## 2022-01-19 NOTE — Telephone Encounter (Signed)
Specialty pharm phone number [phone]

## 2022-01-21 NOTE — Telephone Encounter (Signed)
Spoke with the patient and he states he has reached the pharmacy and they told him the medication was on its way . I asked that he call me when he gets the medication, so we can set up a four week follow up after starting treatment.

## 2022-01-26 NOTE — Telephone Encounter (Signed)
Patient states he received his medication yesterday. He has not started taking it as he was unsure if he needed to have labs drawn prior.Per Dr. Laural Golden no need for labs patient is good to start his medication. He will need another Tb test done 05/2022. Patient made aware he can start the medication and he will need to have Quantiferon Gold test repeated 05/2022 and aware the office will contact to remind him. Patient will call us back with the date he starts the medication as he will need to follow up here in the office four weeks after starting. Reminder placed in [place] reminders for [phone].

## 2022-01-29 NOTE — Telephone Encounter (Signed)
I called to see if the patient had started the Rinvoq he states no he has not since he has had a cold. He plans on starting it next week. He wanted to know if he is supposed to continue with the Mesalamine once he starts the Rinvoq. I advised to hold of starting the medication until I clarified and make sure he is over his sickness.

## 2022-02-02 NOTE — Telephone Encounter (Signed)
Patient aware per Dr. Laural Golden he will need to continue the Mesalamine along with the Rinvoq at least until the four week follow up visit. He states understanding. Patient states he started the Rinvoq on 02/01/2022. He is aware he will need a follow up visit at four weeks. I asked if I could transfer him to scheduling and he states he is busy and will call back this afternoon to set up an appointment.

## 2022-02-09 NOTE — Telephone Encounter (Signed)
Patient was transferred to [facility] to schedule follow up appointment with [facility] or Dr.Rehman for follow up on Rinvoq around week of 03/01/2022.

## 2022-02-10 NOTE — Telephone Encounter (Signed)
Please call the patient to set up an appointment for around 03/01/2022 week, to follow up on Rinvoq after four weeks started.

## 2022-02-23 NOTE — Telephone Encounter (Signed)
Patient is aware of appointment and will be here on Monday 03/01/2022 for follow up on Rinvoq. ?

## 2022-03-01 ENCOUNTER — Other Ambulatory Visit (INDEPENDENT_AMBULATORY_CARE_PROVIDER_SITE_OTHER): Payer: Self-pay

## 2022-03-01 ENCOUNTER — Other Ambulatory Visit: Payer: Self-pay

## 2022-03-01 ENCOUNTER — Encounter (INDEPENDENT_AMBULATORY_CARE_PROVIDER_SITE_OTHER): Payer: Self-pay | Admitting: Gastroenterology

## 2022-03-01 ENCOUNTER — Ambulatory Visit (INDEPENDENT_AMBULATORY_CARE_PROVIDER_SITE_OTHER): Payer: 59 | Admitting: Gastroenterology

## 2022-03-01 VITALS — BP 127/94 | HR 76 | Temp 97.9°F | Ht 70.0 in | Wt 193.0 lb

## 2022-03-01 DIAGNOSIS — K5100 Ulcerative (chronic) pancolitis without complications: Secondary | ICD-10-CM

## 2022-03-01 DIAGNOSIS — Z79620 Long term (current) use of immunosuppressive biologic: Secondary | ICD-10-CM | POA: Diagnosis not present

## 2022-03-01 MED ORDER — RINVOQ 30 MG PO TB24: 1.0000 | ORAL_TABLET | Freq: Every day | ORAL | 3 refills | Status: DC

## 2022-03-01 NOTE — Patient Instructions (Signed)
Im glad you are doing so well on the Rinvoq ?We will update basic labs today ?We will recheck your cholesterol labs after 12 weeks on the medication and TB and Hepatitis testing will be repeated again in June. As we discussed labs are routinely monitored as biologic medications can put you at a higher risk for developing certain infections, please let us know if you develop any new symptoms. ? ?Will see you back in 4-6 weeks.  ?

## 2022-03-01 NOTE — Telephone Encounter (Signed)
Per Vikki Ports ok to start 30 mg once per day .Patient doing well on Rinvoq 45 mg one per day. I have submitted the new rx to [facility]. ?

## 2022-03-01 NOTE — Progress Notes (Cosign Needed)
Referring Provider: No ref. provider found Primary Care Physician:  Noreene Larsson, NP (Inactive) Primary GI Physician: Rehman  Chief Complaint  Patient presents with   Ulcerative Colitis    Patient started on rinvoq 17m about one month ago. States he has seen improved on med.    HPI:   Cory James James a 41y.o. male with past medical history of pan ulcerative colitis, benign brain tumor and pancreatitis.  Patient presenting today for follow up of UC, s/p 4 weeks after induction of Rinvoq 452m  last seen 01/05/22  Patient diagnosed with UC in March 2020 after presenting with chronic diarrhea and rectal bleeding, stool studies negative, he underwent colonoscopy that revealed moderately active pan ulcerative colitis.was treated with prednisone and mesalamine without continued improvement of symptoms.  Started on Entyvio in July 2022 and continued on mesalamine at that time. Did well on first 2 doses of entyvio but had issues with getting to his infusion appointment and was unable to receive his 3rd infusion. He presented in December with 12-13 stools per day with some rectal bleeding. Patient expressed interest in oral medication as he had difficulty finding time to go for infusions. Rinvoq was discussed and patient was amenable to trying this, first dose of 45 mg started on 02/01/22 with plans for Patient to be on 4579maily x8 weeks then transition to 78m8mily.   Last Hep B and TB testing in June 2022 were both negative. Calprotectin 367 on 06/12/21. CBC and CMP done 05/28/21 with WBC 12.3, abs neutrophils 8.2, calcium 8.6 otherwise WNL.  Today, patient States that he is doing very well on the medication, having 2-3 BMs per day that are more formed, has not noticed any blood or mucus in stools. No longer having any nocturnal stools. States that after a few days of starting Rinvoq, he began to notice improvement in his symptoms. Denies any abdominal pain. Appetite and weight are stable.  Denies any nausea or vomiting. Denies any upper respiratory symptoms, no fevers, chills, cough, nausea, vomiting. Denies any joint pain or rashes. No yellowing of skin or eyes or pruritus. Patient did have concerns about a pamphlet he received in the mail regarding possible side effects of the medication as it mentioned Hepatitis, TB and liver issues.   Last Colonoscopy:02/18/20- The examined portion of the ileum was normal. - Moderately active (Mayo Score 2) pancolitis ulcerative colitis, new since the last examination. Biopsied. - Stool specimen sent to lab for GI Pathogens, Last Endoscopy:n/a  Past Medical History:  Diagnosis Date   Acute midline thoracic back pain 12/26/2019   Adult acne 12/26/2019   Allergy    seasonal   Brain tumor (benign) (HCC)[place] Colitis, ulcerative (HCC)[place] Pancreatitis     Past Surgical History:  Procedure Laterality Date   BRAIN SURGERY     COLONOSCOPY WITH PROPOFOL N/A 02/18/2020   Procedure: COLONOSCOPY WITH PROPOFOL;  Surgeon: RehmRogene Houston;  [facility];  Service: Endoscopy;  Laterality: N/A;    Current Outpatient Medications  Medication Sig Dispense Refill   Aspirin-Salicylamide-Caffeine (BC FAST PAIN RELIEF) 650-195-33.3 MG PACK Take 1 packet by mouth daily as needed (headaches/pain.).     Mesalamine 800 MG TBEC Take 2 tablets (1,600 mg total) by mouth in the morning and at bedtime. 360 tablet 3   Upadacitinib ER (RINVOQ) 45 MG TB24 Take 45 mg by mouth daily at 6 (six) AM. For eight (8) weeks, then will switch  to 30 mg daily. 30 tablet 1   No current facility-administered medications for this visit.    Allergies as of 03/01/2022   (No Known Allergies)    Family History  Problem Relation Age of Onset   Hypertension Mother    Diabetes Mother    Healthy Father    Healthy Sister     Social History   Socioeconomic History   Marital status: Married    Spouse name: [name]   Number of children: 2   Years of education: 16    Highest education level: GED or equivalent  Occupational History   Not on file  Tobacco Use   Smoking status: Some Days    Packs/day: 0.50    Years: 18.00    Pack years: 9.00    Types: E-cigarettes, Cigarettes    Last attempt to quit: 12/2020    Years since quitting: 1.1   Smokeless tobacco: Never  Vaping Use   Vaping Use: Every day   Last attempt to quit: 12/20/2020   Substances: Nicotine  Substance and Sexual Activity   Alcohol use: Not Currently    Comment: rarely   Drug use: Not Currently    Comment: occassion use   Sexual activity: Yes  Other Topics Concern   Not on file  Social History Narrative   Married for 10 years to [place]      Two Children:   2020      Vansh 28 years old      [place] 66 years old      No pets    Social Determinants of Health   Financial Resource Strain: Not on file  Food Insecurity: Not on file  Transportation Needs: Not on file  Physical Activity: Not on file  Stress: Not on file  Social Connections: Not on file   Review of systems General: negative for malaise, night sweats, fever, chills, weight loss Neck: Negative for lumps, goiter, pain and significant neck swelling Resp: Negative for cough, wheezing, dyspnea at rest CV: Negative for chest pain, leg swelling, palpitations, orthopnea GI: denies melena, hematochezia, nausea, vomiting, diarrhea, constipation, dysphagia, odyonophagia, early satiety or unintentional weight loss.  MSK: Negative for joint pain or swelling, back pain, and muscle pain. Derm: Negative for itching or rash Psych: Denies depression, anxiety, memory loss, confusion. No homicidal or suicidal ideation.  Heme: Negative for prolonged bleeding, bruising easily, and swollen nodes. Endocrine: Negative for cold or heat intolerance, polyuria, polydipsia and goiter. Neuro: negative for tremor, gait imbalance, syncope and seizures. The remainder of the review of systems is noncontributory.  Physical Exam: BP (!) 127/94  (BP Location: Right Arm, Patient Position: Sitting, Cuff Size: Large)    Pulse 76    Temp 97.9 F (36.6 C) (Oral)    Ht 5' 10"  (1.778 Cory James)    Wt 193 lb (87.5 kg)    BMI 27.69 kg/Cory James  General:   Alert and oriented. No distress noted. Pleasant and cooperative.  Head:  Normocephalic and atraumatic. Eyes:  Conjuctiva clear without scleral icterus. Mouth:  Oral mucosa pink and moist. Good dentition. No lesions. Heart: Normal rate and rhythm, s1 and s2 heart sounds present.  Lungs: Clear lung sounds in all lobes. Respirations equal and unlabored. Abdomen:  +BS, soft, non-tender and non-distended. No rebound or guarding. No HSM or masses noted. Derm: No palmar erythema or jaundice Msk:  Symmetrical without gross deformities. Normal posture. Extremities:  Without edema. Neurologic:  Alert and  oriented x4 Psych:  Alert and cooperative.  Normal mood and affect.  Invalid input(s): 6 MONTHS   ASSESSMENT: Cory James James is a 41 y.o. male presenting today for 4 week follow up after starting Rinvoq 60m daily.  Patient doing very well on Rinvoq thus far. Having 2-3 formed BMs per day without any rectal bleeding or mucus, no longer having nocturnal stools. Feels that the medication is working very well for him. He is continued on mesalamine 16069mBID at this time. Will discuss timing of stopping mesalamine with Dr. ReLaural Goldengiven he is doing so well on Rinvoq. He is without any upper respiratory symptoms, fevers, chills, cough, nausea, vomiting, joint pain, rashes, changes in skin color, pruritus.  He did have concerns about possible side effects of Rinvoq. I discussed the risks vs. Benefits of JAK inhibitors to include higher risk of developing certain infections, hepatitis, TB, liver damage, elevation in lipid panel, certain cancers (especially skin). I also discussed routine monitoring of blood counts, liver function, Hepatitis and TB testing. Patient is aware he should let usKoreanow if any new symptoms he  develops while on the medication.   All questions were answered, patient verbalized understanding and is in agreement with plan as outline above.    PLAN:  CBC w diff/CMP today 2. Will need lipid panel repeat after 12 wks (May 8th) 3. Hep B and TB due June 2023 4. Discuss timing of stopping mesalamine with Dr. ReLaural Golden. Plan to go down to 3023minvoq daily, on 03/29/22  Follow Up: 4-6 weeks  Lewanda Perea L. CarAlver SorrowSN, APRN, AGNP-C Adult-Gerontology Nurse Practitioner [facility]

## 2022-03-08 ENCOUNTER — Other Ambulatory Visit (INDEPENDENT_AMBULATORY_CARE_PROVIDER_SITE_OTHER): Payer: Self-pay | Admitting: *Deleted

## 2022-03-08 ENCOUNTER — Other Ambulatory Visit (INDEPENDENT_AMBULATORY_CARE_PROVIDER_SITE_OTHER): Payer: Self-pay | Admitting: Internal Medicine

## 2022-03-08 ENCOUNTER — Telehealth (INDEPENDENT_AMBULATORY_CARE_PROVIDER_SITE_OTHER): Payer: Self-pay | Admitting: *Deleted

## 2022-03-08 DIAGNOSIS — K5100 Ulcerative (chronic) pancolitis without complications: Secondary | ICD-10-CM

## 2022-03-08 NOTE — Progress Notes (Signed)
Talked with patient. ?Would like to get fecal calprotectin prior to stopping mesalamine as discussed with Ms. Scherrie Gerlach, NP. ?Patient tells me the pharmacy sent him 30 mg of Rinvoq instead of 45 mg dose.  He is supposed to be on 45 mg daily for 2 months and thereafter 30 mg daily. ? ?Patient will go to the lab for fecal calprotectin next week. ? ?

## 2022-03-08 NOTE — Progress Notes (Signed)
Patient received 4 week supply of rinvoq 45 mg and then 73m. He has been on 323mfor last 8 -10 days. Patient talked to Dr. ReLaural Goldennd Dr. ReLaural Goldenold me he needs to be on 4566mor 4 more weeks. I called pharm and spoke with Pharmacist [phone] was told they did have refill on 90m1md he would get it sent to patient. Per dr rehmLaural Goldenient needs to do fecal calprotein not this week but when he is feeling better. I called patient and let him know pharm will be sending 90mg55m him to take for additional 4 weeks then drop down to 30mg 61mfor him to do stool test when feeling better. Order left at front for pick up.  ?

## 2022-03-10 NOTE — Progress Notes (Signed)
I called patient to see if he received rinvoq and he has not received yet. He told me he would call pharm and let me know if any problems.  ?

## 2022-03-11 NOTE — Progress Notes (Signed)
Patient still has not gotten 2nd month of rinvoq. Pharmacist told me Monday he would get it sent out. I called back to pharmacy and was told no order was placed for 68m and last sent out was for 328mon 3/10. I told pharm to just continue 3025mefills every month and I would give pt samples of 10m65m patient. I called patient and discussed with him. He states he will pick up 10mg29mples today and take one daily for 28 days then go to 30mg.67mdvised patient to call me if he has any issues with getting his med in the future and to make sure dose is 30mg w85mhe gets next shipment from pharm. He verbalized understanding. ?

## 2022-03-23 NOTE — Telephone Encounter (Signed)
error 

## 2022-04-15 ENCOUNTER — Encounter (INDEPENDENT_AMBULATORY_CARE_PROVIDER_SITE_OTHER): Payer: Self-pay | Admitting: Gastroenterology

## 2022-04-15 ENCOUNTER — Ambulatory Visit (INDEPENDENT_AMBULATORY_CARE_PROVIDER_SITE_OTHER): Payer: 59 | Admitting: Gastroenterology

## 2022-04-15 VITALS — BP 137/95 | HR 60 | Temp 97.7°F | Ht 70.0 in | Wt 191.7 lb

## 2022-04-15 DIAGNOSIS — Z79620 Long term (current) use of immunosuppressive biologic: Secondary | ICD-10-CM

## 2022-04-15 DIAGNOSIS — K5100 Ulcerative (chronic) pancolitis without complications: Secondary | ICD-10-CM

## 2022-04-15 NOTE — Patient Instructions (Addendum)
-  Please Complete fecal calprotectin and other labs ordered at last visit, it is important that we have these to evaluate your blood counts, liver function and inflammatory markers in stool. We will determine if you need to go back on mesalamine once these are back. ?-we need to check your lipid panel around [facility] May ?-We need to repeat Hep B and TB testing in June 2023, we will mail you these orders closer to time ?-Continue Rinvoq 70m daily ? ?Please call your PCP and let them know you need to have the Shingrix vaccine as you are on Rinvoq and this puts you at a much higher risk for developing a severe case of Herpes zoster/shingles. ? ?Follow up 3 months ?

## 2022-04-15 NOTE — Progress Notes (Signed)
? ?Referring Provider: No ref. provider found ?Primary Care Physician:  Noreene Larsson, NP (Inactive) ?Primary GI Physician: Rehman ? ?Chief Complaint  ?Patient presents with  ? 6 wk follow up  ?  On Ulcerative Colitis patient states he is doing well no new or worsening symptoms at this time  ? ?HPI:   ?Cory James is a 41 y.o. male with past medical history of pan ulcerative colitis, benign brain tumor and pancreatitis. ? ?Patient presenting today for follow up of ulcerative colitis, maintained on Rinvoq. ? ?Patient diagnosed with UC in March 2020 after presenting with chronic diarrhea and rectal bleeding, stool studies negative, he underwent colonoscopy that revealed moderately active pan ulcerative colitis.was treated with prednisone and mesalamine without continued improvement of symptoms.  Started on Entyvio in July 2022 and continued on mesalamine at that time. Did well on first 2 doses of entyvio but had issues with getting to his infusion appointment and was unable to receive his 3rd infusion. He presented in December with 12-13 stools per day with some rectal bleeding. Patient expressed interest in oral medication as he had difficulty finding time to go for infusions. Rinvoq was discussed and patient was amenable to trying this, first dose of 45 mg started on 02/01/22 with plans for Patient to be on 8m daily x8 weeks then transition to 323mdaily.  ?  ?Last Hep B and TB testing in June 2022 were both negative. Calprotectin 367 on 06/12/21. CBC and CMP done 05/28/21 with WBC 12.3, abs neutrophils 8.2, calcium 8.6 otherwise WNL. ? ?Last seen 03/01/22. At that visit he was doing very well, having 2-3 BMs per day that are more formed, has not noticed any blood or mucus in stools. No longer having any nocturnal stools. States that after a few days of starting Rinvoq, he began to notice improvement in his symptoms. Denies any abdominal pain. Appetite and weight are stable. Denies any nausea or vomiting. Denies  any upper respiratory symptoms, no fevers, chills, cough, nausea, vomiting. Denies any joint pain or rashes. No yellowing of skin or eyes or pruritus. Patient did have concerns about a pamphlet he received in the mail regarding possible side effects of the medication as it mentioned Hepatitis, TB and liver issues ? ?Pt Spoke with Dr. ReLaural Goldenhereafter, who recommended that patient have repeat fecal calprotectin prior to stopping mesalamine. Lab ordered on 3/20 but not yet completed.  ? ?Today, patient started on Rinvoq 305maily maybe 3-4 days ago. States that BMs are a little looser on the lower dose. he is having 4-5 BMs per day. He reports that he ran out of his mesalamine maybe 1 day before he took his last 67m68mse. Denies any blood in stools or abdominal pain. States that appetite is good. Weight is stable. He denies any respiratory symptoms, fevers, or chills. States that he has noticed a small breakout near his mouth (singular lesion). He has had this in the past and usually can open it up, clean it and it will resolve, however, this time it has been more persistent. No other skin lesions or rashes.  ? ? ?Last Colonoscopy:02/18/20- The examined portion of the ileum was normal. ?- Moderately active (Mayo Score 2) pancolitis ulcerative colitis, new since the last ?examination. Biopsied. ?- Stool specimen sent to lab for GI Pathogens, ?Last Endoscopy:n/a ? ?Recommendations:  ? ? ?Past Medical History:  ?Diagnosis Date  ? Acute midline thoracic back pain 12/26/2019  ? Adult acne 12/26/2019  ? Allergy   ?  seasonal  ? Brain tumor (benign) ([place])   ? Colitis, ulcerative ([place])   ? Pancreatitis   ? ? ?Past Surgical History:  ?Procedure Laterality Date  ? BRAIN SURGERY    ? COLONOSCOPY WITH PROPOFOL N/A 02/18/2020  ? Procedure: COLONOSCOPY WITH PROPOFOL;  Surgeon: Rogene Houston, MD;  [facility];  Service: Endoscopy;  Laterality: N/A;  ? ? ?Current Outpatient Medications  ?Medication Sig Dispense Refill  ?  Aspirin-Salicylamide-Caffeine (BC FAST PAIN RELIEF) 650-195-33.3 MG PACK Take 1 packet by mouth daily as needed (headaches/pain.).    ? Mesalamine 800 MG TBEC Take 2 tablets (1,600 mg total) by mouth in the morning and at bedtime. 360 tablet 3  ? Upadacitinib ER (RINVOQ) 30 MG TB24 Take 1 tablet by mouth daily at 6 (six) AM. 90 tablet 3  ? ?No current facility-administered medications for this visit.  ? ? ?Allergies as of 04/15/2022  ? (No Known Allergies)  ? ? ?Family History  ?Problem Relation Age of Onset  ? Hypertension Mother   ? Diabetes Mother   ? Healthy Father   ? Healthy Sister   ? ? ?Social History  ? ?Socioeconomic History  ? Marital status: Married  ?  Spouse name: Cory James  ? Number of children: 2  ? Years of education: 10  ? Highest education level: GED or equivalent  ?Occupational History  ? Not on file  ?Tobacco Use  ? Smoking status: Some Days  ?  Packs/day: 0.50  ?  Years: 18.00  ?  Pack years: 9.00  ?  Types: E-cigarettes, Cigarettes  ?  Last attempt to quit: 12/2020  ?  Years since quitting: 1.3  ? Smokeless tobacco: Never  ?Vaping Use  ? Vaping Use: Every day  ? Last attempt to quit: 12/20/2020  ? Substances: Nicotine  ?Substance and Sexual Activity  ? Alcohol use: Not Currently  ?  Comment: rarely  ? Drug use: Not Currently  ?  Comment: occassion use  ? Sexual activity: Yes  ?Other Topics Concern  ? Not on file  ?Social History Narrative  ? Married for 10 years to [place]  ?   ? Two Children:  ? 2020  ?   ? Cory James 77 years old  ?   ? Cory James 29 years old  ?   ? No pets   ? ?Social Determinants of Health  ? ?Financial Resource Strain: Not on file  ?Food Insecurity: Not on file  ?Transportation Needs: Not on file  ?Physical Activity: Not on file  ?Stress: Not on file  ?Social Connections: Not on file  ? ?Review of systems ?General: negative for malaise, night sweats, fever, chills, weight loss ?Neck: Negative for lumps, goiter, pain and significant neck swelling ?Resp: Negative for cough, wheezing, dyspnea  at rest ?CV: Negative for chest pain, leg swelling, palpitations, orthopnea ?GI: denies melena, hematochezia, nausea, vomiting, diarrhea, constipation, dysphagia, odyonophagia, early satiety or unintentional weight loss. +looser stools  ?MSK: Negative for joint pain or swelling, back pain, and muscle pain. ?Derm: Negative for itching or rash ?Psych: Denies depression, anxiety, memory loss, confusion. No homicidal or suicidal ideation.  ?Heme: Negative for prolonged bleeding, bruising easily, and swollen nodes. ?Endocrine: Negative for cold or heat intolerance, polyuria, polydipsia and goiter. ?Neuro: negative for tremor, gait imbalance, syncope and seizures. ?The remainder of the review of systems is noncontributory. ? ?Physical Exam: ?BP (!) 137/95 (BP Location: Right Arm, Patient Position: Sitting, Cuff Size: Normal)   Pulse 60   Temp  97.7 ?F (36.5 ?C) (Oral)   Ht 5' 10"  (1.778 m)   Wt 191 lb 11.2 oz (87 kg)   BMI 27.51 kg/m?  ?General:   Alert and oriented. No distress noted. Pleasant and cooperative.  ?Head:  Normocephalic and atraumatic. ?Eyes:  Conjuctiva clear without scleral icterus. ?Mouth:  Oral mucosa pink and moist. Good dentition. No lesions. ?Heart: Normal rate and rhythm, s1 and s2 heart sounds present.  ?Lungs: Clear lung sounds in all lobes. Respirations equal and unlabored. ?Abdomen:  +BS, soft, non-tender and non-distended. No rebound or guarding. No HSM or masses noted. ?Derm: No palmar erythema or jaundice ?Msk:  Symmetrical without gross deformities. Normal posture. ?Extremities:  Without edema. ?Neurologic:  Alert and  oriented x4 ?Psych:  Alert and cooperative. Normal mood and affect. ? ?Invalid input(s): 6 MONTHS  ? ?ASSESSMENT: ?Taz Vanness is a 41 y.o. male presenting today for follow up of UC since starting Rinvoq in February 2023 and transitioning to 69m daily 4-5 days ago. ? ?Notably, patient has not completed CBC, CMP and Fecal calprotectin that were recently ordered. I  discussed importance of keeping labs updated. Pt planning to have these done in the next few days. He did run out of his mesalamine around the time he dropped dose of Rinvoq to 333mdaily and is having somewhat

## 2022-04-16 ENCOUNTER — Telehealth (INDEPENDENT_AMBULATORY_CARE_PROVIDER_SITE_OTHER): Payer: Self-pay

## 2022-04-16 NOTE — Telephone Encounter (Signed)
Lipid panel mailed 04/16/2022 so patient can have drawn mid May 2023.Hep B surface antigen and Quant TB Gold test in reminders to have done around 06/10/2022. ?

## 2022-04-23 ENCOUNTER — Ambulatory Visit: Payer: 59 | Admitting: Nurse Practitioner

## 2022-05-21 ENCOUNTER — Other Ambulatory Visit (INDEPENDENT_AMBULATORY_CARE_PROVIDER_SITE_OTHER): Payer: Self-pay | Admitting: *Deleted

## 2022-05-21 ENCOUNTER — Telehealth (INDEPENDENT_AMBULATORY_CARE_PROVIDER_SITE_OTHER): Payer: Self-pay | Admitting: *Deleted

## 2022-05-21 DIAGNOSIS — K5100 Ulcerative (chronic) pancolitis without complications: Secondary | ICD-10-CM

## 2022-05-21 DIAGNOSIS — Z79620 Long term (current) use of immunosuppressive biologic: Secondary | ICD-10-CM

## 2022-05-21 NOTE — Telephone Encounter (Addendum)
-----   Message from Karle Barr, [place] sent at 01/26/2022  8:42 AM EST ----- Regarding: TB test to be repeated 05/2022 per Dr. Laural Golden TB test to be repeated 05/2022 per Dr. Laural Golden. Quantiferon Gold.   I called patient and he wanted labs mailed to him to do this month and I placed order in the mail.

## 2022-07-19 ENCOUNTER — Ambulatory Visit (INDEPENDENT_AMBULATORY_CARE_PROVIDER_SITE_OTHER): Payer: 59 | Admitting: Gastroenterology

## 2022-08-01 IMAGING — CT CT ABD-PELV W/ CM
2 of 4 series · 16 of 46 positions shown, 18 images · IV contrast (omnipaque)
Comparison: None.

CLINICAL DATA: Left flank pain x1 day.

EXAM:
CT ABDOMEN AND PELVIS WITH CONTRAST
TECHNIQUE: Multidetector CT imaging of the abdomen and pelvis was performed
using the standard protocol following bolus administration of
intravenous contrast.
CONTRAST:  100mL OMNIPAQUE IOHEXOL 300 MG/ML  SOLN

[Series 2: axial st · axial · 0.86mm/px · z∈[+904,+1394]mm · 13 of 108 slices shown, 15 images]
[im 5/108  soft-tissue]
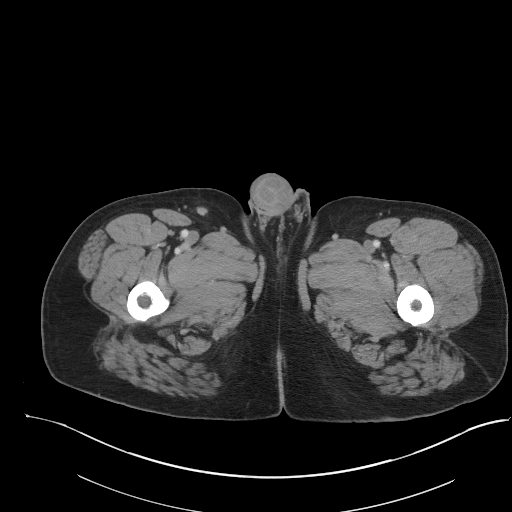
[im 5/108  bone]
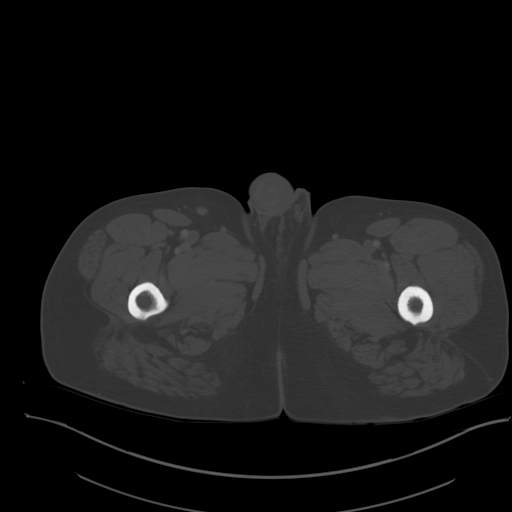
[im 14/108  soft-tissue]
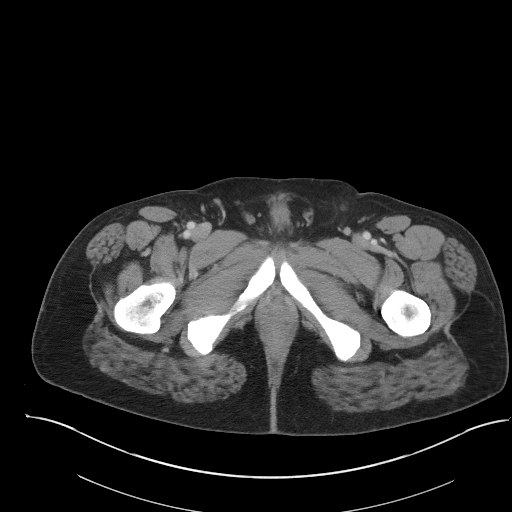
[im 24/108  soft-tissue]
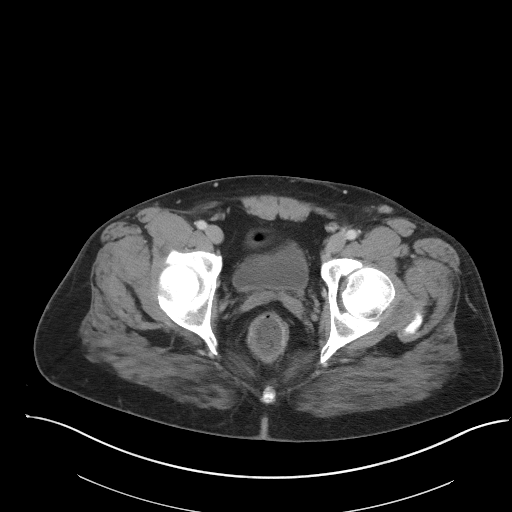
[im 28/108  soft-tissue]
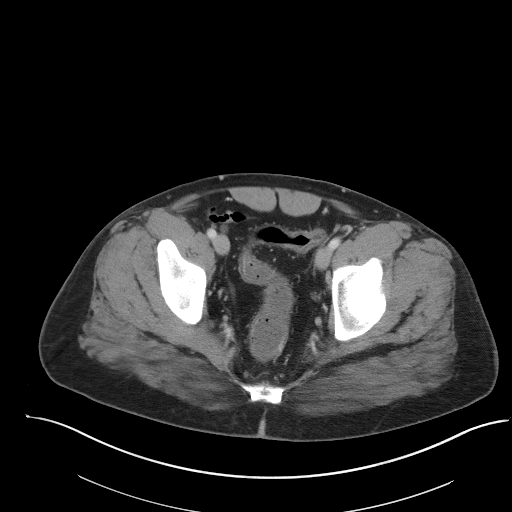
[im 38/108  soft-tissue]
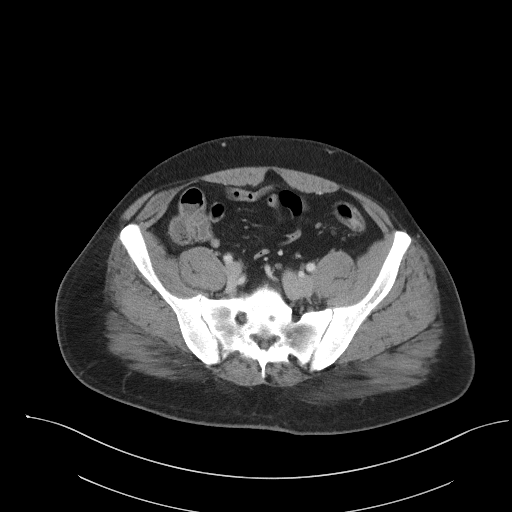
[im 47/108  soft-tissue]
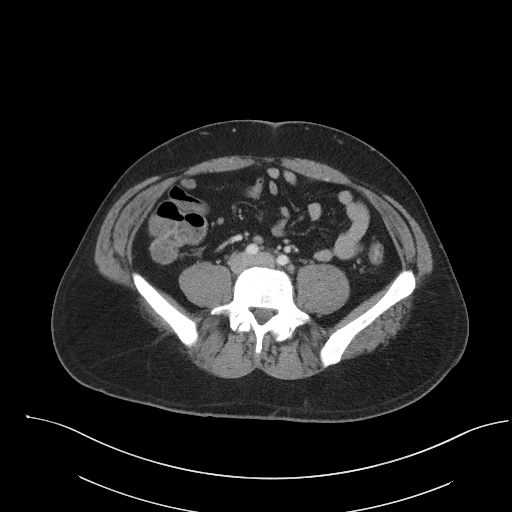
[im 56/108  soft-tissue]
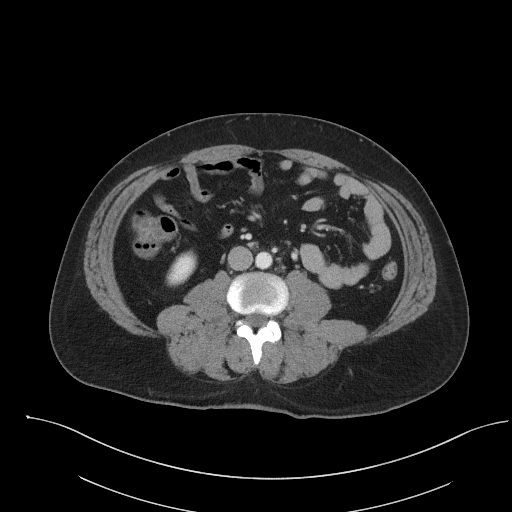
[im 61/108  soft-tissue]
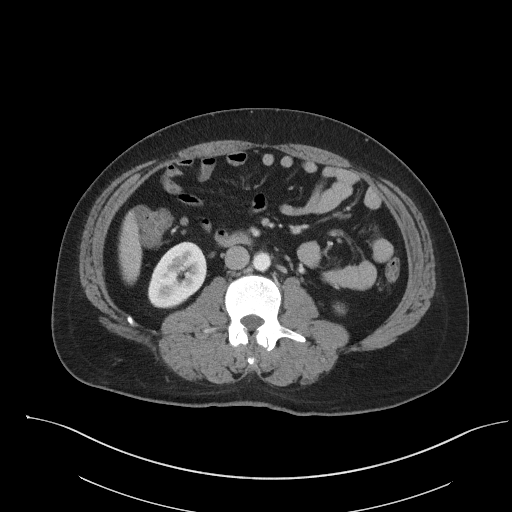
[im 70/108  soft-tissue]
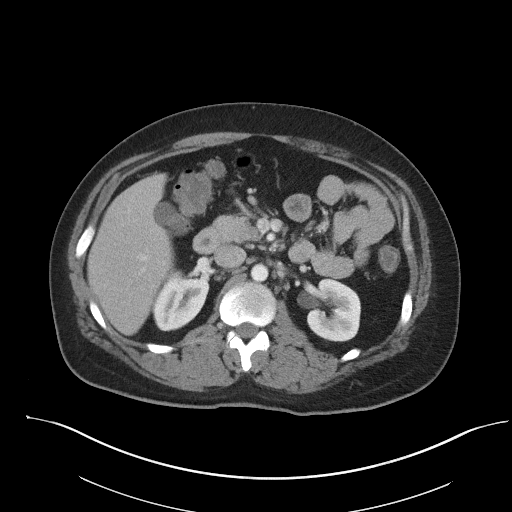
[im 70/108  bone]
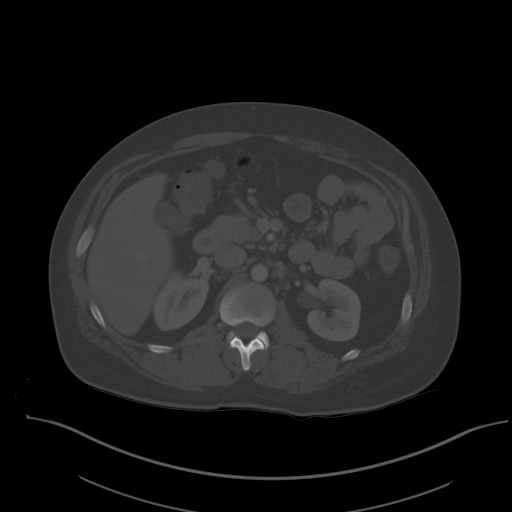
[im 80/108  soft-tissue]
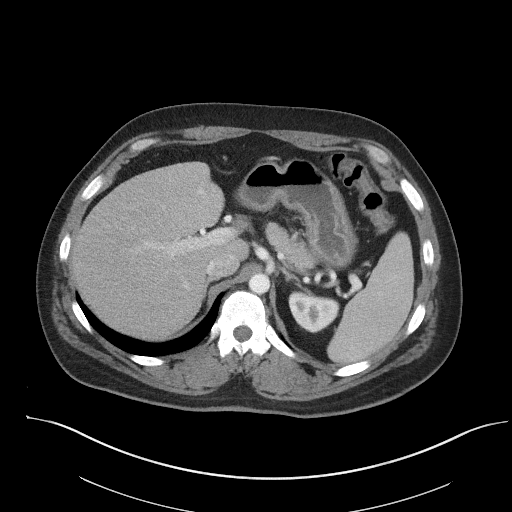
[im 84/108  soft-tissue]
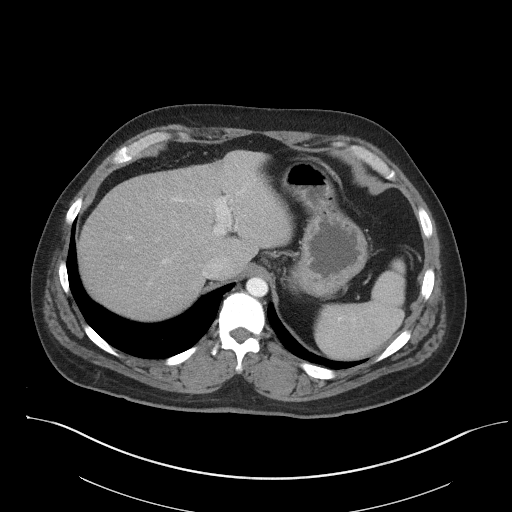
[im 94/108  soft-tissue]
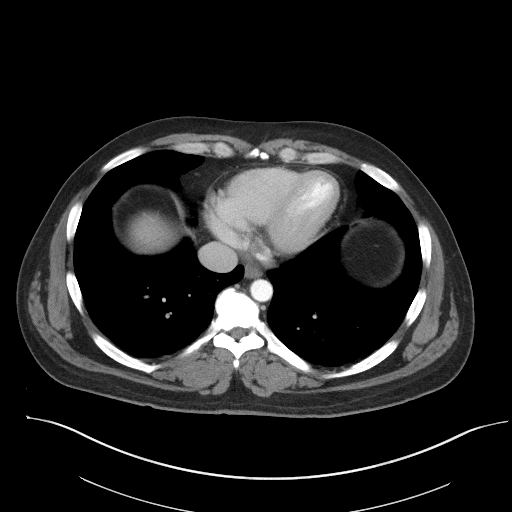
[im 103/108  soft-tissue]
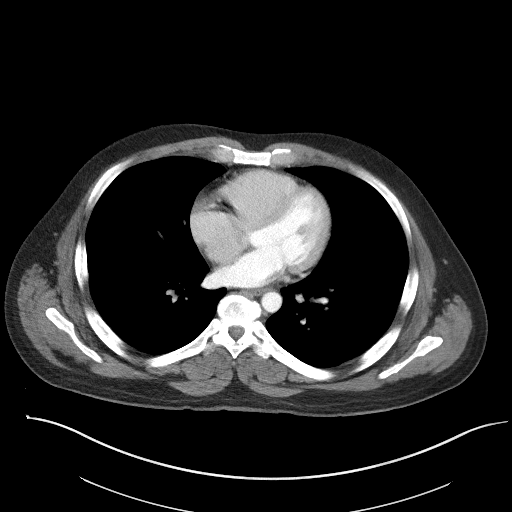

[Series 5: coronal st · coronal · 0.80mm/px · 3 of 117 slices shown]
[im 39/117  soft-tissue]
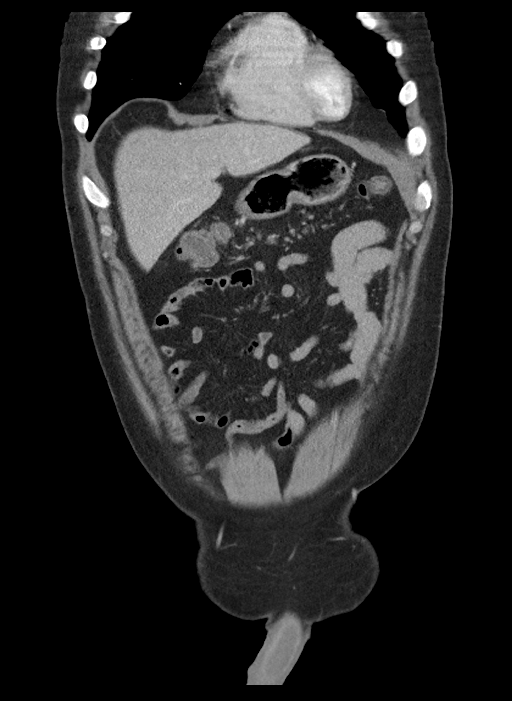
[im 52/117  soft-tissue]
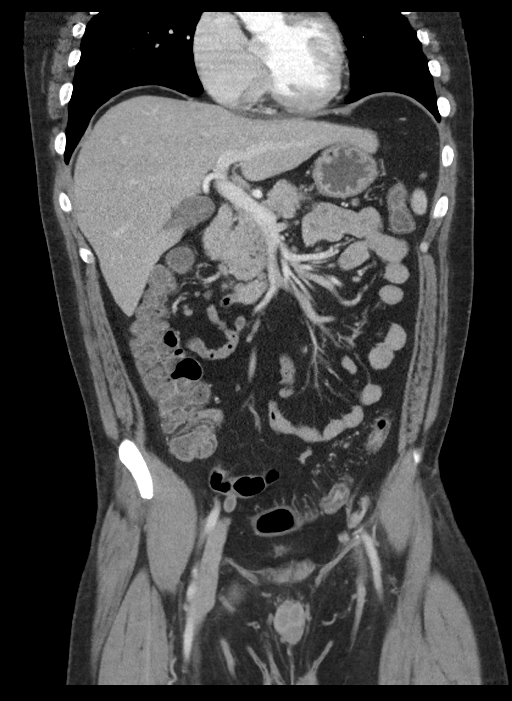
[im 65/117  soft-tissue]
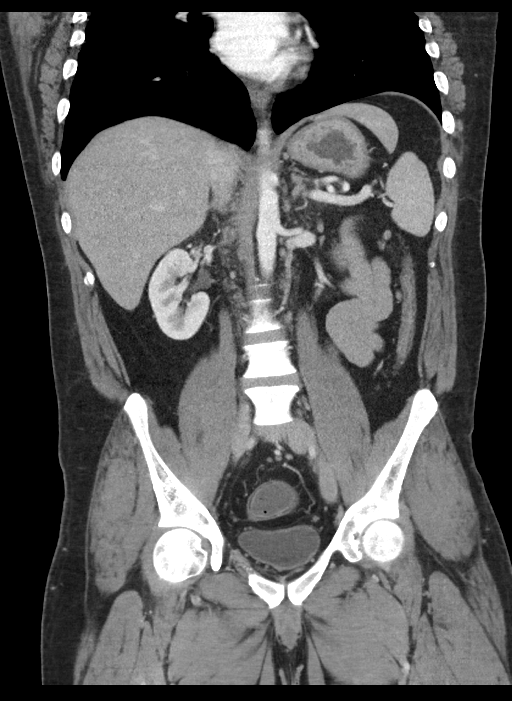

[16 of 46 positions shown; findings below may reference images not displayed]

FINDINGS: Lower chest: No acute abnormality.

Hepatobiliary: No focal liver abnormality is seen. No gallstones,
gallbladder wall thickening, or biliary dilatation.

Pancreas: Unremarkable. No pancreatic ductal dilatation or
surrounding inflammatory changes.

Spleen: Normal in size without focal abnormality.

Adrenals/Urinary Tract: Adrenal glands are unremarkable. Kidneys are
normal, without renal calculi, focal lesion, or hydronephrosis.
Bladder is unremarkable.

Stomach/Bowel: Stomach is within normal limits. Appendix appears
normal. No evidence of bowel dilatation. The proximal to mid sigmoid
colon is mildly thickened and inflamed.

Vascular/Lymphatic: No significant vascular findings are present. No
enlarged abdominal or pelvic lymph nodes.

Reproductive: Uterus and bilateral adnexa are unremarkable.

Other: No abdominal wall hernia or abnormality. No abdominopelvic
ascites.

Musculoskeletal: No acute or significant osseous findings.
IMPRESSION: Mild sigmoid colitis.

## 2022-08-26 ENCOUNTER — Ambulatory Visit (INDEPENDENT_AMBULATORY_CARE_PROVIDER_SITE_OTHER): Payer: 59 | Admitting: Internal Medicine

## 2022-08-26 ENCOUNTER — Encounter: Payer: Self-pay | Admitting: Internal Medicine

## 2022-08-26 VITALS — BP 130/80 | HR 68 | Ht 70.0 in | Wt 187.6 lb

## 2022-08-26 DIAGNOSIS — Z0000 Encounter for general adult medical examination without abnormal findings: Secondary | ICD-10-CM | POA: Diagnosis not present

## 2022-08-26 DIAGNOSIS — R0981 Nasal congestion: Secondary | ICD-10-CM

## 2022-08-26 DIAGNOSIS — Z87898 Personal history of other specified conditions: Secondary | ICD-10-CM | POA: Diagnosis not present

## 2022-08-26 DIAGNOSIS — Z23 Encounter for immunization: Secondary | ICD-10-CM | POA: Diagnosis not present

## 2022-08-26 DIAGNOSIS — K5120 Ulcerative (chronic) proctitis without complications: Secondary | ICD-10-CM

## 2022-08-26 DIAGNOSIS — R0683 Snoring: Secondary | ICD-10-CM | POA: Diagnosis not present

## 2022-08-26 NOTE — Progress Notes (Unsigned)
   Complete physical exam  Patient: Nosson Wender   DOB: Aug 15, 1981   41 y.o. Male  MRN: [id number]  Subjective:    Chief Complaint  Patient presents with   Annual Exam    Breathing issues    Kyng Matlock is a 41 y.o. male who presents today for a complete physical exam. He reports consuming a {diet types:17450} diet. {types:19826} He generally feels {DESC; WELL/FAIRLY WELL/POORLY:18703}. He reports sleeping {DESC; WELL/FAIRLY WELL/POORLY:18703}. He {does/does not:200015} have additional problems to discuss today.    Most recent fall risk assessment:    08/26/2022    2:52 PM  [place] in the past year? 0  Number falls in past yr: 0  Injury with Fall? 0  Risk for fall due to : No Fall Risks  Follow up Falls evaluation completed     Most recent depression screenings:    08/26/2022    2:52 PM 03/26/2021    1:11 PM  PHQ 2/9 Scores  PHQ - 2 Score 0 0    {VISON DENTAL STD PSA (Optional):27386}  {History (Optional):23778}  Patient Care Team: Noreene Larsson, NP as PCP - General (Nurse Practitioner)   Outpatient Medications Prior to Visit  Medication Sig   Aspirin-Salicylamide-Caffeine (BC FAST PAIN RELIEF) 650-195-33.3 MG PACK Take 1 packet by mouth daily as needed (headaches/pain.).   Upadacitinib ER (RINVOQ) 30 MG TB24 Take 1 tablet by mouth daily at 6 (six) AM.   Mesalamine 800 MG TBEC Take 2 tablets (1,600 mg total) by mouth in the morning and at bedtime. (Patient not taking: Reported on 08/26/2022)   No facility-administered medications prior to visit.    ROS        Objective:     BP 130/80   Pulse 68   Ht 5' 10"  (1.778 m)   Wt 187 lb 9.6 oz (85.1 kg)   SpO2 99%   BMI 26.92 kg/m  {Vitals History (Optional):23777}  Physical Exam   No results found for any visits on 08/26/22. {Show previous labs (optional):23779}    Assessment & Plan:    Routine Health Maintenance and Physical Exam  Immunization History  Administered Date(s)  Administered   Influenza,inj,Quad PF,6+ Mos 02/02/2019, 11/28/2019, 12/10/2020    Health Maintenance  Topic Date Due   COVID-19 Vaccine (1) Never done   HIV Screening  Never done   Hepatitis C Screening  Never done   TETANUS/TDAP  Never done   INFLUENZA VACCINE  07/20/2022   HPV VACCINES  Aged Out    Discussed health benefits of physical activity, and encouraged him to engage in regular exercise appropriate for his age and condition.  Problem List Items Addressed This Visit   None  No follow-ups on file.     Johnette Abraham, MD

## 2022-08-26 NOTE — Patient Instructions (Signed)
It was a pleasure to see you today.  Thank you for giving [place] the opportunity to be involved in your care.  Below is a brief recap of your visit and next steps.  We will plan to see you again in 3 months  Summary We are checking basic labs today and you will receive your flu shot I recommend trying saline rinses followed by nasacort to see if this relieves your congestion, if not we can refer you back to ENT I have ordered a home sleep test for your snoring.  Next steps Follow up in 3 months We will make a nursing appointment for you to receive your shingles vaccines We will work on getting records from [place] I will notify you of lab results tomorrow

## 2022-08-27 ENCOUNTER — Other Ambulatory Visit: Payer: Self-pay | Admitting: Internal Medicine

## 2022-08-27 DIAGNOSIS — E559 Vitamin D deficiency, unspecified: Secondary | ICD-10-CM

## 2022-08-27 LAB — CMP14+EGFR
ALT: 26 IU/L (ref 0–44)
AST: 20 IU/L (ref 0–40)
Albumin/Globulin Ratio: 1.6 (ref 1.2–2.2)
Albumin: 4.6 g/dL (ref 4.1–5.1)
Alkaline Phosphatase: 101 IU/L (ref 44–121)
BUN/Creatinine Ratio: 13 (ref 9–20)
BUN: 13 mg/dL (ref 6–24)
Bilirubin Total: 0.3 mg/dL (ref 0.0–1.2)
CO2: 24 mmol/L (ref 20–29)
Calcium: 9.2 mg/dL (ref 8.7–10.2)
Chloride: 104 mmol/L (ref 96–106)
Creatinine, Ser: 1.02 mg/dL (ref 0.76–1.27)
Globulin, Total: 2.9 g/dL (ref 1.5–4.5)
Glucose: 87 mg/dL (ref 70–99)
Potassium: 4.4 mmol/L (ref 3.5–5.2)
Sodium: 141 mmol/L (ref 134–144)
Total Protein: 7.5 g/dL (ref 6.0–8.5)
eGFR: 95 mL/min/{1.73_m2} (ref >59)

## 2022-08-27 LAB — CBC WITH DIFFERENTIAL/PLATELET
Basophils Absolute: 0.1 10*3/uL (ref 0.0–0.2)
Basos: 1 %
EOS (ABSOLUTE): 0.1 10*3/uL (ref 0.0–0.4)
Eos: 1 %
Hematocrit: 42.8 % (ref 37.5–51.0)
Hemoglobin: 14.5 g/dL (ref 13.0–17.7)
Immature Grans (Abs): 0.2 10*3/uL — ABNORMAL HIGH (ref 0.0–0.1)
Immature Granulocytes: 2 %
Lymphocytes Absolute: 3.6 10*3/uL — ABNORMAL HIGH (ref 0.7–3.1)
Lymphs: 33 %
MCH: 28 pg (ref 26.6–33.0)
MCHC: 33.9 g/dL (ref 31.5–35.7)
MCV: 83 fL (ref 79–97)
Monocytes Absolute: 0.7 10*3/uL (ref 0.1–0.9)
Monocytes: 6 %
Neutrophils Absolute: 6.3 10*3/uL (ref 1.4–7.0)
Neutrophils: 57 %
Platelets: 245 10*3/uL (ref 150–450)
RBC: 5.18 x10E6/uL (ref 4.14–5.80)
RDW: 13 % (ref 11.6–15.4)
WBC: 10.8 10*3/uL (ref 3.4–10.8)

## 2022-08-27 LAB — B12 AND FOLATE PANEL
Folate: 12 ng/mL (ref >3.0)
Vitamin B-12: 279 pg/mL (ref 232–1245)

## 2022-08-27 LAB — HCV INTERPRETATION

## 2022-08-27 LAB — LIPID PANEL
Chol/HDL Ratio: 4.5 ratio (ref 0.0–5.0)
Cholesterol, Total: 212 mg/dL — ABNORMAL HIGH (ref 100–199)
HDL: 47 mg/dL (ref >39)
LDL Chol Calc (NIH): 130 mg/dL — ABNORMAL HIGH (ref 0–99)
Triglycerides: 195 mg/dL — ABNORMAL HIGH (ref 0–149)
VLDL Cholesterol Cal: 35 mg/dL (ref 5–40)

## 2022-08-27 LAB — HIV ANTIBODY (ROUTINE TESTING W REFLEX): HIV Screen 4th Generation wRfx: NONREACTIVE

## 2022-08-27 LAB — VITAMIN D 25 HYDROXY (VIT D DEFICIENCY, FRACTURES): Vit D, 25-Hydroxy: 5.3 ng/mL — ABNORMAL LOW (ref 30.0–100.0)

## 2022-08-27 LAB — HCV AB W REFLEX TO QUANT PCR: HCV Ab: NONREACTIVE

## 2022-08-27 MED ORDER — VITAMIN D (ERGOCALCIFEROL) 1.25 MG (50000 UNIT) PO CAPS: 50000.0000 [IU] | ORAL_CAPSULE | ORAL | 0 refills | Status: AC

## 2022-08-30 DIAGNOSIS — Z0000 Encounter for general adult medical examination without abnormal findings: Secondary | ICD-10-CM | POA: Insufficient documentation

## 2022-08-30 NOTE — Assessment & Plan Note (Signed)
Followed by GI.  Symptoms well controlled with Rinvoq. -No changes today. -Checking vitamin D level

## 2022-08-30 NOTE — Assessment & Plan Note (Signed)
Chronic issue.  Previously referred to ENT for evaluation.  Nasacort + saline rinses recommended. -I reviewed ENT recommendations with the patient.  He will try Nasacort and saline rinses.  If there is no improvement, plan to refer back to ENT for reevaluation

## 2022-08-30 NOTE — Assessment & Plan Note (Addendum)
Seen today for annual physical exam. -Basic labs ordered today, including one-time HIV/HCV screenings -Flu shot administered today -He plans to make a nurse appointment to receive his Shingrix vaccine (indicated due to immunocompromised state) -Plans to get COVID-19 vaccines at [facility] and will bring records -Tdap deferred until outside records are obtained

## 2022-08-30 NOTE — Assessment & Plan Note (Signed)
Previously referred for home sleep study to rule out sleep apnea, however this was not completed. -New referral placed today

## 2022-08-30 NOTE — Assessment & Plan Note (Signed)
History of acoustic neuroma s/p resection.  This was performed in [place], [place].  He was told prior to moving that he would need routine surveillance imaging, but this has not been followed up upon.  Previously referred to neurology. -Will obtain outside records and make necessary referrals.

## 2022-08-31 ENCOUNTER — Ambulatory Visit: Payer: 59

## 2022-09-29 ENCOUNTER — Other Ambulatory Visit (INDEPENDENT_AMBULATORY_CARE_PROVIDER_SITE_OTHER): Payer: Self-pay

## 2022-09-29 ENCOUNTER — Ambulatory Visit (INDEPENDENT_AMBULATORY_CARE_PROVIDER_SITE_OTHER): Payer: 59 | Admitting: Gastroenterology

## 2022-09-29 ENCOUNTER — Ambulatory Visit (INDEPENDENT_AMBULATORY_CARE_PROVIDER_SITE_OTHER): Payer: 59

## 2022-09-29 ENCOUNTER — Encounter (INDEPENDENT_AMBULATORY_CARE_PROVIDER_SITE_OTHER): Payer: Self-pay | Admitting: Gastroenterology

## 2022-09-29 VITALS — BP 111/76 | HR 74 | Temp 98.0°F | Ht 70.0 in | Wt 186.3 lb

## 2022-09-29 DIAGNOSIS — Z23 Encounter for immunization: Secondary | ICD-10-CM

## 2022-09-29 DIAGNOSIS — K5100 Ulcerative (chronic) pancolitis without complications: Secondary | ICD-10-CM | POA: Diagnosis not present

## 2022-09-29 NOTE — Patient Instructions (Addendum)
Perform blood workup Schedule colonoscopy Continue Rinvoq 30 mg qday Dermatology referral Proceed with shingles vaccination

## 2022-09-29 NOTE — Progress Notes (Signed)
Cory James, M.D. Gastroenterology & Hepatology [facility] [address] [place], [place] [address]  Primary Care Physician: Johnette Abraham, MD [place] [address]  I will communicate my assessment and recommendations to the referring MD via EMR.  Problems: Ulcerative pancolitis on room [place]  History of Present Illness: Cory James is a 41 y.o. male with past medical history of ulcerative colitis and acoustic neuroma, who presents for follow up of ulcerative colitis.  The patient was last seen on 04/15/2022. At that time, the patient was advised to check his fecal calprotectin, but this was not performed.  He was continued on Rinvoq 30 mg every day.  Most recent labs from 08/26/2022 showed normal CMP with AST 20, ALT 26, total bilirubin 0.3, alkaline phosphatase 101, normal electrolytes and renal function, CBC with WBC 10.8, hemoglobin 14.5, platelets 245, cholesterol mildly increased level of 212, triglycerides 195 and LDL of 130.  HCV was negative.  Patient states feelin very well while taking his medication.  He is taking Rinvoq 30 mg qday. He is having usually 2-3 Bms per day with normal consistency, unless when he eats something spicy.  Had a recent respiratory infection for which he received azithormycin, now is recoverin.  The patient denies having any nausea, vomiting, fever, chills, hematochezia, melena, hematemesis, abdominal distention, abdominal pain, diarrhea, jaundice, pruritus or weight loss.  Patient asked if it is possible to take the Rinvoq every other day or to take a lower dose.  Last flu shot:2 weeks ago Last pneumonia shot:never Last evaluation by dermatology: never Last zoster vaccine: never COVID-19 shot: got Moderna x2  Last Colonoscopy: 2021 - The examined portion of the ileum was normal. - Moderately active (Mayo Score 2) pancolitis ulcerative colitis, new since the  last examination. Biopsied.  Past Medical History: Past Medical History:  Diagnosis Date   Acute midline thoracic back pain 12/26/2019   Adult acne 12/26/2019   Allergy    seasonal   Brain tumor (benign) ([place])    Colitis, ulcerative ([place])    Pancreatitis     Past Surgical History: Past Surgical History:  Procedure Laterality Date   BRAIN SURGERY     COLONOSCOPY WITH PROPOFOL N/A 02/18/2020   Procedure: COLONOSCOPY WITH PROPOFOL;  Surgeon: Rogene Houston, MD;  [facility];  Service: Endoscopy;  Laterality: N/A;    Family History: Family History  Problem Relation Age of Onset   Hypertension Mother    Diabetes Mother    Healthy Father    Healthy Sister     Social History: Social History   Tobacco Use  Smoking Status Some Days   Packs/day: 0.50   Years: 18.00   Total pack years: 9.00   Types: E-cigarettes, Cigarettes   Last attempt to quit: 12/2020   Years since quitting: 1.7  Smokeless Tobacco Never   Social History   Substance and Sexual Activity  Alcohol Use Not Currently   Comment: rarely   Social History   Substance and Sexual Activity  Drug Use Not Currently   Comment: occassion use    Allergies: No Known Allergies  Medications: Current Outpatient Medications  Medication Sig Dispense Refill   Aspirin-Salicylamide-Caffeine (BC FAST PAIN RELIEF) 650-195-33.3 MG PACK Take 1 packet by mouth daily as needed (headaches/pain.).     Upadacitinib ER (RINVOQ) 30 MG TB24 Take 1 tablet by mouth daily at 6 (six) AM. 90 tablet 3   Vitamin D, Ergocalciferol, (DRISDOL)  1.25 MG (50000 UNIT) CAPS capsule Take 1 capsule (50,000 Units total) by mouth every 7 (seven) days for 12 doses. 12 capsule 0   No current facility-administered medications for this visit.    Review of Systems: GENERAL: negative for malaise, night sweats HEENT: No changes in hearing or vision, no nose bleeds or other nasal problems. NECK: Negative for lumps, goiter, pain and  significant neck swelling RESPIRATORY: Negative for cough, wheezing CARDIOVASCULAR: Negative for chest pain, leg swelling, palpitations, orthopnea GI: SEE HPI MUSCULOSKELETAL: Negative for joint pain or swelling, back pain, and muscle pain. SKIN: Negative for lesions, rash PSYCH: Negative for sleep disturbance, mood disorder and recent psychosocial stressors. HEMATOLOGY Negative for prolonged bleeding, bruising easily, and swollen nodes. ENDOCRINE: Negative for cold or heat intolerance, polyuria, polydipsia and goiter. NEURO: negative for tremor, gait imbalance, syncope and seizures. The remainder of the review of systems is noncontributory.   Physical Exam: BP 111/76 (BP Location: Left Arm, Patient Position: Sitting, Cuff Size: Large)   Pulse 74   Temp 98 F (36.7 C) (Oral)   Ht 5' 10"  (1.778 m)   Wt 186 lb 4.8 oz (84.5 kg)   BMI 26.73 kg/m  GENERAL: The patient is AO x3, in no acute distress. HEENT: Head is normocephalic and atraumatic. EOMI are intact. Mouth is well hydrated and without lesions. NECK: Supple. No masses LUNGS: Clear to auscultation. No presence of rhonchi/wheezing/rales. Adequate chest expansion HEART: RRR, normal s1 and s2. ABDOMEN: Soft, nontender, no guarding, no peritoneal signs, and nondistended. BS +. No masses. EXTREMITIES: Without any cyanosis, clubbing, rash, lesions or edema. NEUROLOGIC: AOx3, no focal motor deficit. SKIN: no jaundice, no rashes  Imaging/Labs: as above  I personally reviewed and interpreted the available labs, imaging and endoscopic files.  Impression and Plan: [name] is a 41 y.o. male with past medical history of ulcerative colitis and acoustic neuroma, who presents for follow up of ulcerative colitis.  The patient has presented send for improvement of his symptoms while taking Rinvoq 30 mg every day and he actually has achieved clinical remission.  We discussed the importance of assessing endoscopic remission or  improvement 6 months after starting therapy, for which he will be scheduled for a colonoscopy which he is agreeable to proceed with.  We also discussed the possibility of considering decreasing the dosage to 50 mg if he is interested on this, with the caveat that he may not respond to the same dose and it may be harder or not possible to " catch up" his disease control with going back to 30 mg.  For now he will continue on the same dosage.  I also emphasized importance of getting his Shingrix as soon as possible given the inherent risk of zoster while taking Rinvoq, he will proceed with this.  He will also be referred for dermatology evaluation for annual screening given the fact he has IBD and taking Rinvoq.  We will order hepatitis B serology and TB testing.  - Check Quantiferon and hepatitis serologies - Schedule colonoscopy - Continue Rinvoq 30 mg qday - Dermatology referral - Proceed with shingles vaccination - RTC 6 months  All questions were answered.      Cory Peppers, MD Gastroenterology and Hepatology [facility]

## 2022-09-30 DIAGNOSIS — K5100 Ulcerative (chronic) pancolitis without complications: Secondary | ICD-10-CM | POA: Diagnosis not present

## 2022-10-02 ENCOUNTER — Ambulatory Visit
Admission: EM | Admit: 2022-10-02 | Discharge: 2022-10-02 | Disposition: A | Discharge status: Home / Self Care | Payer: 59 | Attending: Urgent Care | Admitting: Urgent Care

## 2022-10-02 ENCOUNTER — Telehealth: Payer: Self-pay | Admitting: Emergency Medicine

## 2022-10-02 DIAGNOSIS — R490 Dysphonia: Secondary | ICD-10-CM

## 2022-10-02 DIAGNOSIS — J018 Other acute sinusitis: Secondary | ICD-10-CM | POA: Diagnosis not present

## 2022-10-02 DIAGNOSIS — R052 Subacute cough: Secondary | ICD-10-CM | POA: Diagnosis not present

## 2022-10-02 DIAGNOSIS — R0982 Postnasal drip: Secondary | ICD-10-CM | POA: Diagnosis not present

## 2022-10-02 DIAGNOSIS — K51919 Ulcerative colitis, unspecified with unspecified complications: Secondary | ICD-10-CM | POA: Diagnosis not present

## 2022-10-02 MED ORDER — LEVOCETIRIZINE DIHYDROCHLORIDE 5 MG PO TABS: 5.0000 mg | ORAL_TABLET | Freq: Every evening | ORAL | 0 refills | Status: DC

## 2022-10-02 MED ORDER — PSEUDOEPHEDRINE HCL 60 MG PO TABS: 60.0000 mg | ORAL_TABLET | Freq: Three times a day (TID) | ORAL | 0 refills | Status: DC | PRN

## 2022-10-02 MED ORDER — AMOXICILLIN-POT CLAVULANATE 875-125 MG PO TABS: 1.0000 | ORAL_TABLET | Freq: Two times a day (BID) | ORAL | 0 refills | Status: DC

## 2022-10-02 NOTE — ED Provider Notes (Signed)
[facility]  Note:  This document was prepared using Dragon voice recognition software and may include unintentional dictation errors.  MRN: [id number] DOB: Apr 25, 1981  Subjective:   Cory James is a 41 y.o. male presenting for 2-week history of persistent coughing, throat pain, postnasal drainage.  Patient was seen at a different urgent care and was prescribed azithromycin and methylprednisolone.  This made no difference to his symptoms.  No chest pain, shortness of breath or wheezing.  He does have a history of ulcerative colitis and is on Rinvoq.  Reports that he has not taken it as consistently because he was advised by the pharmacist to avoid it while he was on methylprednisolone.  No history of respiratory disorders.  However, patient does have a history of chronic nasal congestion, chronic rhinitis, chronic allergic rhinitis.  He is avoiding any regular medications for this since he is taking Rinvoq.  No current facility-administered medications for this encounter.  Current Outpatient Medications:    Aspirin-Salicylamide-Caffeine (BC FAST PAIN RELIEF) 650-195-33.3 MG PACK, Take 1 packet by mouth daily as needed (headaches/pain.)., Disp: , Rfl:    Upadacitinib ER (RINVOQ) 30 MG TB24, Take 1 tablet by mouth daily at 6 (six) AM., Disp: 90 tablet, Rfl: 3   Vitamin D, Ergocalciferol, (DRISDOL) 1.25 MG (50000 UNIT) CAPS capsule, Take 1 capsule (50,000 Units total) by mouth every 7 (seven) days for 12 doses., Disp: 12 capsule, Rfl: 0   No Known Allergies  Past Medical History:  Diagnosis Date   Acute midline thoracic back pain 12/26/2019   Adult acne 12/26/2019   Allergy    seasonal   Brain tumor (benign) ([place])    Colitis, ulcerative ([place])    Pancreatitis      Past Surgical History:  Procedure Laterality Date   BRAIN SURGERY     COLONOSCOPY WITH PROPOFOL N/A 02/18/2020   Procedure: COLONOSCOPY WITH PROPOFOL;  Surgeon: Rogene Houston, MD;  [facility];   Service: Endoscopy;  Laterality: N/A;    Family History  Problem Relation Age of Onset   Hypertension Mother    Diabetes Mother    Healthy Father    Healthy Sister     Social History   Tobacco Use   Smoking status: Some Days    Packs/day: 0.50    Years: 18.00    Total pack years: 9.00    Types: E-cigarettes, Cigarettes    Last attempt to quit: 12/2020    Years since quitting: 1.7   Smokeless tobacco: Never  Vaping Use   Vaping Use: Former   Quit date: 12/20/2020   Substances: Nicotine  Substance Use Topics   Alcohol use: Not Currently    Comment: rarely   Drug use: Not Currently    Comment: occassion use    ROS   Objective:   Vitals: BP 122/75 (BP Location: Right Arm)   Pulse 70   Temp 98 F (36.7 C) (Oral)   Resp 18   SpO2 98%   Physical Exam Constitutional:      General: He is not in acute distress.    Appearance: Normal appearance. He is well-developed and normal weight. He is not ill-appearing, toxic-appearing or diaphoretic.  HENT:     Head: Normocephalic and atraumatic.     Right Ear: External ear normal.     Left Ear: External ear normal.     Nose: Congestion present. No rhinorrhea.     Mouth/Throat:     Mouth: Mucous membranes are moist.  Pharynx: Posterior oropharyngeal erythema present. No pharyngeal swelling, oropharyngeal exudate or uvula swelling.     Tonsils: No tonsillar exudate or tonsillar abscesses. 0 on the right. 0 on the left.     Comments: With thick postnasal drainage streaks overlying pharynx.  Uvula is also erythematous and swollen. Eyes:     General: No scleral icterus.       Right eye: No discharge.        Left eye: No discharge.     Extraocular Movements: Extraocular movements intact.  Cardiovascular:     Rate and Rhythm: Normal rate and regular rhythm.     Heart sounds: Normal heart sounds. No murmur heard.    No friction rub. No gallop.  Pulmonary:     Effort: Pulmonary effort is normal. No respiratory distress.      Breath sounds: Normal breath sounds. No stridor. No wheezing, rhonchi or rales.  Musculoskeletal:     Cervical back: Normal range of motion.  Neurological:     Mental Status: He is alert and oriented to person, place, and time.  Psychiatric:        Mood and Affect: Mood normal.        Behavior: Behavior normal.        Thought Content: Thought content normal.        Judgment: Judgment normal.     Assessment and Plan :   PDMP not reviewed this encounter.  1. Acute non-recurrent sinusitis of other sinus   2. Post-nasal drainage   3. Hoarseness   4. Subacute cough   5. Ulcerative colitis with complication, unspecified location ([place])     Recommended Augmentin to address sinusitis, uvulitis.  Offered chest x-ray but patient declined.  Emphasized need for compliance with his Rinvoq.  Patient prefers to avoid steroids for now.  Recommended he start Xyzal long-term and use pseudoephedrine as needed. Counseled patient on potential for adverse effects with medications prescribed/recommended today, ER and return-to-clinic precautions discussed, patient verbalized understanding.    Jaynee Eagles, PA-C 10/02/22 1454

## 2022-10-02 NOTE — Telephone Encounter (Signed)
Pt asked for meds to be sent to [facility] due to [facility] did not accept his insurance.

## 2022-10-02 NOTE — Discharge Instructions (Addendum)
Take 10 days of Augmentin for your sinus infection. Take Xyzal every day long term to keep your sinuses in check. Use pseudoephedrine for the next 3-4 days and then only as needed.

## 2022-10-02 NOTE — ED Triage Notes (Signed)
Pt reports an infection in the throat, coughing started 2 and a half weeks ago. Went to other UC and they gave him azithro and methypredisnolone Provided little to no relief

## 2022-10-04 ENCOUNTER — Ambulatory Visit (INDEPENDENT_AMBULATORY_CARE_PROVIDER_SITE_OTHER): Payer: 59 | Admitting: Gastroenterology

## 2022-10-06 LAB — HEPATITIS B SURFACE ANTIGEN: Hepatitis B Surface Ag: NONREACTIVE

## 2022-10-06 LAB — QUANTIFERON-TB GOLD PLUS
Mitogen-NIL: >10 IU/mL
NIL: 0.1 IU/mL
QuantiFERON-TB Gold Plus: NEGATIVE
TB1-NIL: <0 IU/mL
TB2-NIL: <0 IU/mL

## 2022-10-28 ENCOUNTER — Telehealth: Payer: Self-pay | Admitting: Internal Medicine

## 2022-10-28 NOTE — Telephone Encounter (Signed)
Patient called in regard to colonoscopy.  States hasn't heard anything else in regard to appointment to have colonoscopy done.  Patient wants a call back.

## 2022-10-28 NOTE — Telephone Encounter (Signed)
Pt didn't answer.

## 2022-10-29 NOTE — Telephone Encounter (Signed)
Needs to contact [phone]- Dr Eula Listen said on his last AVS that colonoscopy would be scheduled

## 2022-11-04 ENCOUNTER — Telehealth (INDEPENDENT_AMBULATORY_CARE_PROVIDER_SITE_OTHER): Payer: Self-pay | Admitting: *Deleted

## 2022-11-04 ENCOUNTER — Encounter: Payer: Self-pay | Admitting: Internal Medicine

## 2022-11-04 ENCOUNTER — Encounter: Payer: 59 | Admitting: Internal Medicine

## 2022-11-04 MED ORDER — PEG 3350-KCL-NA BICARB-NACL 420 G PO SOLR: 4000.0000 mL | Freq: Once | ORAL | 0 refills | Status: AC

## 2022-11-04 NOTE — Telephone Encounter (Signed)
Spoke with pt. Apologized to patient as to I didn't know why he wasn't scheduled. He is on for 12/20 at 1:30pm. Aware will send instructions and rx for prep. He wants [facility].

## 2022-11-04 NOTE — Telephone Encounter (Signed)
Patient called primary care office to get referral for colonoscopy. Primary care told him to call our office because he is already established here and last AVS from October visit had on it that colonoscopy would be scheduled. Patient wants to go forward with scheduling.

## 2022-11-04 NOTE — Progress Notes (Deleted)
   Acute Telephone Visit  Virtual Visit via Telephone Note  I connected with Cory James on 11/04/22 at  8:20 AM EST by telephone and verified that I am speaking with the correct person using two identifiers.  Location: Patient: *** Provider: ***   I discussed the limitations, risks, security and privacy concerns of performing an evaluation and management service by telephone and the availability of in person appointments. I also discussed with the patient that there may be a patient responsible charge related to this service. The patient expressed understanding and agreed to proceed.   History of Present Illness:    Observations/Objective:   Assessment and Plan:   Follow Up Instructions:    I discussed the assessment and treatment plan with the patient. The patient was provided an opportunity to ask questions and all were answered. The patient agreed with the plan and demonstrated an understanding of the instructions.   The patient was advised to call back or seek an in-person evaluation if the symptoms worsen or if the condition fails to improve as anticipated.  I provided *** minutes of non-face-to-face time during this encounter.   Johnette Abraham, MD

## 2022-11-09 NOTE — Progress Notes (Signed)
Erroneous encounter

## 2022-11-25 ENCOUNTER — Ambulatory Visit: Payer: 59 | Admitting: Internal Medicine

## 2022-12-06 ENCOUNTER — Encounter: Payer: Self-pay | Admitting: Internal Medicine

## 2022-12-06 ENCOUNTER — Ambulatory Visit (INDEPENDENT_AMBULATORY_CARE_PROVIDER_SITE_OTHER): Payer: 59 | Admitting: Internal Medicine

## 2022-12-06 VITALS — BP 123/84 | HR 62 | Ht 70.0 in | Wt 196.6 lb

## 2022-12-06 DIAGNOSIS — E559 Vitamin D deficiency, unspecified: Secondary | ICD-10-CM

## 2022-12-06 DIAGNOSIS — E782 Mixed hyperlipidemia: Secondary | ICD-10-CM

## 2022-12-06 DIAGNOSIS — Z1329 Encounter for screening for other suspected endocrine disorder: Secondary | ICD-10-CM

## 2022-12-06 DIAGNOSIS — K5100 Ulcerative (chronic) pancolitis without complications: Secondary | ICD-10-CM | POA: Diagnosis not present

## 2022-12-06 DIAGNOSIS — Z23 Encounter for immunization: Secondary | ICD-10-CM | POA: Diagnosis not present

## 2022-12-06 DIAGNOSIS — R0683 Snoring: Secondary | ICD-10-CM | POA: Diagnosis not present

## 2022-12-06 DIAGNOSIS — Z86018 Personal history of other benign neoplasm: Secondary | ICD-10-CM | POA: Diagnosis not present

## 2022-12-06 DIAGNOSIS — Z87898 Personal history of other specified conditions: Secondary | ICD-10-CM

## 2022-12-06 DIAGNOSIS — Z131 Encounter for screening for diabetes mellitus: Secondary | ICD-10-CM | POA: Diagnosis not present

## 2022-12-06 NOTE — Patient Instructions (Signed)
It was a pleasure to see you today.  Thank you for giving [place] the opportunity to be involved in your care.  Below is a brief recap of your visit and next steps.  We will plan to see you again in 6 months.  Summary I have placed a referral to neurosurgery today due to history of brain tumor We will update lab work You will receive your tetanus vaccine We will coordinate a home sleep study Follow up in 6 months

## 2022-12-06 NOTE — Progress Notes (Signed)
Established Patient Office Visit  Subjective   Patient ID: Cory James, male    DOB: [birth year]  Age: 41 y.o. MRN: [id number]  Chief Complaint  Patient presents with   Snoring    Follow up   Cory James returns to care today.  He was last seen by me on 9/7 for his annual exam.  Outside records from [place] were requested, a home sleep test was ordered, and 25-monthfollow-up was arranged.  In the interim he has established care with gastroenterology and is scheduled for colonoscopy in 12/20. Today Mr. PStockingerstates that he feels well. He has no acute concerns to discuss.   Past Medical History:  Diagnosis Date   Acute midline thoracic back pain 12/26/2019   Adult acne 12/26/2019   Allergy    seasonal   [place]    [place]    Pancreatitis    Past Surgical History:  Procedure Laterality Date   BRAIN SURGERY     COLONOSCOPY WITH PROPOFOL N/A 02/18/2020   Procedure: COLONOSCOPY WITH PROPOFOL;  Surgeon: RRogene Houston MD;  [facility];  Service: Endoscopy;  Laterality: N/A;   Social History   Tobacco Use   Smoking status: Some Days    Packs/day: 0.50    Years: 18.00    Total pack years: 9.00    Types: E-cigarettes, Cigarettes    Last attempt to quit: 12/2020    Years since quitting: 1.9   Smokeless tobacco: Never  Vaping Use   Vaping Use: Every day   Last attempt to quit: 12/20/2020   Substances: Nicotine  Substance Use Topics   Alcohol use: Not Currently    Comment: rarely   Drug use: Not Currently    Comment: occassion use   Family History  Problem Relation Age of Onset   Hypertension Mother    Diabetes Mother    Healthy Father    Healthy Sister    No Known Allergies  Review of Systems  HENT:         Dry tongue   All other systems reviewed and are negative.    Objective:     BP 123/84   Pulse 62   Ht _0  (1.778 m)   Wt 196 lb 9.6 oz (89.2 kg)   SpO2 97%   BMI 28.21 kg/m  BP Readings from Last 3  Encounters:  12/08/22 102/64  12/06/22 123/84  10/02/22 122/75   Physical Exam Vitals reviewed.  Constitutional:      General: He is not in acute distress.    Appearance: Normal appearance. He is not ill-appearing.  HENT:     Head: Normocephalic and atraumatic.     Nose: Nose normal. No congestion or rhinorrhea.     Mouth/Throat:     Mouth: Mucous membranes are moist.     Pharynx: Oropharynx is clear.  Eyes:     Extraocular Movements: Extraocular movements intact.     Conjunctiva/sclera: Conjunctivae normal.     Pupils: Pupils are equal, round, and reactive to light.  Cardiovascular:     Rate and Rhythm: Normal rate and regular rhythm.     Pulses: Normal pulses.     Heart sounds: Normal heart sounds. No murmur heard. Pulmonary:     Effort: Pulmonary effort is normal.     Breath sounds: Normal breath sounds. No wheezing, rhonchi or rales.  Abdominal:     General: Abdomen is flat. Bowel sounds are normal. There is no distension.  Palpations: Abdomen is soft.     Tenderness: There is no abdominal tenderness.  Musculoskeletal:        General: No swelling or deformity. Normal range of motion.     Cervical back: Normal range of motion.  Skin:    General: Skin is warm and dry.     Capillary Refill: Capillary refill takes less than 2 seconds.  Neurological:     General: No focal deficit present.     Mental Status: He is alert and oriented to person, place, and time.     Motor: No weakness.  Psychiatric:        Mood and Affect: Mood normal.        Behavior: Behavior normal.        Thought Content: Thought content normal.    Last CBC Lab Results  Component Value Date   WBC 10.8 08/26/2022   HGB 14.5 08/26/2022   HCT 42.8 08/26/2022   MCV 83 08/26/2022   MCH 28.0 08/26/2022   RDW 13.0 08/26/2022   PLT 245 51/88/4166   Last metabolic panel Lab Results  Component Value Date   GLUCOSE 87 08/26/2022   NA 141 08/26/2022   K 4.4 08/26/2022   CL 104 08/26/2022   CO2  24 08/26/2022   BUN 13 08/26/2022   CREATININE 1.02 08/26/2022   EGFR 95 08/26/2022   CALCIUM 9.2 08/26/2022   PROT 7.5 08/26/2022   ALBUMIN 4.6 08/26/2022   LABGLOB 2.9 08/26/2022   AGRATIO 1.6 08/26/2022   BILITOT 0.3 08/26/2022   ALKPHOS 101 08/26/2022   AST 20 08/26/2022   ALT 26 08/26/2022   ANIONGAP 5 05/28/2021   Last lipids Lab Results  Component Value Date   CHOL 212 (H) 08/26/2022   HDL 47 08/26/2022   LDLCALC 130 (H) 08/26/2022   TRIG 195 (H) 08/26/2022   CHOLHDL 4.5 08/26/2022   Last vitamin D Lab Results  Component Value Date   VD25OH 9.7 (L) 12/06/2022   Last vitamin B12 and Folate Lab Results  Component Value Date   VITAMINB12 279 08/26/2022   FOLATE 12.0 08/26/2022   The 10-year ASCVD risk score (Arnett DK, et al., 2019) is: 3.3%    Assessment & Plan:   Problem List Items Addressed This Visit       Ulcerative colitis ([place])    Followed by GI.  Symptoms remain well-controlled with Rinvoq.  He is scheduled for colonoscopy on 12/20.      H/O brain tumor    History of acoustic neuroma s/p resection on 09/28/2017 in [place], [place].  He needs to establish care with neurosurgery for routine surveillance.  A new referral has been placed today.      Snoring    HST has previously been ordered to screen for OSA due to history of snoring and daytime somnolence.  This has not been completed yet. Symptoms are unchanged.  We will follow-up on the status of this order.      Vitamin D deficiency - Primary    Noted on previous labs.  High-dose, weekly supplementation was ordered.  Repeat vitamin D level ordered today.      Hyperlipidemia    Lipid panel updated in September.  Total cholesterol 212, LDL 130.  His 10-year ASCVD risk score is 3.3%.  He was counseled on lifestyle modifications aimed at improving his cholesterol.  We specifically reviewed the Mediterranean diet.      Need for Tdap vaccination    Tdap administered today (12/06/2022).  Return  in about 6 months (around 06/07/2023).    Johnette Abraham, MD

## 2022-12-07 ENCOUNTER — Other Ambulatory Visit: Payer: Self-pay | Admitting: Internal Medicine

## 2022-12-07 DIAGNOSIS — E559 Vitamin D deficiency, unspecified: Secondary | ICD-10-CM

## 2022-12-07 MED ORDER — VITAMIN D (ERGOCALCIFEROL) 1.25 MG (50000 UNIT) PO CAPS: 50000.0000 [IU] | ORAL_CAPSULE | ORAL | 0 refills | Status: AC

## 2022-12-08 ENCOUNTER — Other Ambulatory Visit: Payer: Self-pay

## 2022-12-08 ENCOUNTER — Encounter (HOSPITAL_COMMUNITY): Payer: Self-pay | Admitting: Gastroenterology

## 2022-12-08 ENCOUNTER — Ambulatory Visit (HOSPITAL_COMMUNITY): Payer: 59 | Admitting: Anesthesiology

## 2022-12-08 ENCOUNTER — Ambulatory Visit (HOSPITAL_BASED_OUTPATIENT_CLINIC_OR_DEPARTMENT_OTHER): Payer: 59 | Admitting: Anesthesiology

## 2022-12-08 ENCOUNTER — Ambulatory Visit (HOSPITAL_COMMUNITY)
Admission: RE | Admit: 2022-12-08 | Discharge: 2022-12-08 | Disposition: A | Discharge status: Home / Self Care | Payer: 59 | Attending: Gastroenterology | Admitting: Gastroenterology

## 2022-12-08 ENCOUNTER — Encounter (HOSPITAL_COMMUNITY)
Admission: RE | Disposition: A | Discharge status: Home / Self Care | Payer: Self-pay | Source: Home / Self Care | Attending: Gastroenterology

## 2022-12-08 DIAGNOSIS — K5100 Ulcerative (chronic) pancolitis without complications: Secondary | ICD-10-CM

## 2022-12-08 DIAGNOSIS — Z09 Encounter for follow-up examination after completed treatment for conditions other than malignant neoplasm: Secondary | ICD-10-CM | POA: Insufficient documentation

## 2022-12-08 DIAGNOSIS — Z1211 Encounter for screening for malignant neoplasm of colon: Secondary | ICD-10-CM | POA: Diagnosis not present

## 2022-12-08 DIAGNOSIS — K51019 Ulcerative (chronic) pancolitis with unspecified complications: Secondary | ICD-10-CM | POA: Diagnosis not present

## 2022-12-08 DIAGNOSIS — D124 Benign neoplasm of descending colon: Secondary | ICD-10-CM

## 2022-12-08 DIAGNOSIS — K5190 Ulcerative colitis, unspecified, without complications: Secondary | ICD-10-CM | POA: Diagnosis not present

## 2022-12-08 DIAGNOSIS — K635 Polyp of colon: Secondary | ICD-10-CM | POA: Diagnosis not present

## 2022-12-08 DIAGNOSIS — F17210 Nicotine dependence, cigarettes, uncomplicated: Secondary | ICD-10-CM | POA: Insufficient documentation

## 2022-12-08 DIAGNOSIS — R69 Illness, unspecified: Secondary | ICD-10-CM | POA: Diagnosis not present

## 2022-12-08 DIAGNOSIS — K6389 Other specified diseases of intestine: Secondary | ICD-10-CM | POA: Diagnosis not present

## 2022-12-08 HISTORY — PX: BIOPSY: SHX5522

## 2022-12-08 HISTORY — PX: POLYPECTOMY: SHX5525

## 2022-12-08 HISTORY — PX: COLONOSCOPY WITH PROPOFOL: SHX5780

## 2022-12-08 LAB — TSH+FREE T4
Free T4: 1.29 ng/dL (ref 0.82–1.77)
TSH: 6.94 u[IU]/mL — ABNORMAL HIGH (ref 0.450–4.500)

## 2022-12-08 LAB — HEMOGLOBIN A1C
Est. average glucose Bld gHb Est-mCnc: 120 mg/dL
Hgb A1c MFr Bld: 5.8 % — ABNORMAL HIGH (ref 4.8–5.6)

## 2022-12-08 LAB — VITAMIN D 25 HYDROXY (VIT D DEFICIENCY, FRACTURES): Vit D, 25-Hydroxy: 9.7 ng/mL — ABNORMAL LOW (ref 30.0–100.0)

## 2022-12-08 SURGERY — COLONOSCOPY WITH PROPOFOL
Anesthesia: General

## 2022-12-08 MED ORDER — PROPOFOL 10 MG/ML IV BOLUS
INTRAVENOUS | Status: DC | PRN
  Administered 2022-12-08: 30 mg via INTRAVENOUS
  Administered 2022-12-08: 50 mg via INTRAVENOUS
  Administered 2022-12-08: 100 mg via INTRAVENOUS
  Administered 2022-12-08: 20 mg via INTRAVENOUS
  Administered 2022-12-08: 50 mg via INTRAVENOUS

## 2022-12-08 MED ORDER — LACTATED RINGERS IV SOLN: INTRAVENOUS | Status: DC

## 2022-12-08 MED ORDER — PROPOFOL 500 MG/50ML IV EMUL
INTRAVENOUS | Status: DC | PRN
  Administered 2022-12-08: 150 ug/kg/min via INTRAVENOUS

## 2022-12-08 MED ORDER — LIDOCAINE HCL 1 % IJ SOLN
INTRAMUSCULAR | Status: DC | PRN
  Administered 2022-12-08: 50 mg via INTRADERMAL

## 2022-12-08 MED ORDER — LACTATED RINGERS IV SOLN: INTRAVENOUS | Status: DC | PRN

## 2022-12-08 NOTE — Anesthesia Postprocedure Evaluation (Signed)
Anesthesia Post Note  Patient: Cory James  Procedure(s) Performed: COLONOSCOPY WITH PROPOFOL BIOPSY POLYPECTOMY  Patient location during evaluation: Endoscopy Anesthesia Type: General Level of consciousness: awake and alert Pain management: pain level controlled Vital Signs Assessment: post-procedure vital signs reviewed and stable Respiratory status: spontaneous breathing Cardiovascular status: blood pressure returned to baseline and stable Postop Assessment: no apparent nausea or vomiting Anesthetic complications: no   No notable events documented.   Last Vitals:  Vitals:   12/08/22 1139 12/08/22 1303  BP: 137/85 102/64  Pulse: 67 68  Resp: 19 19  Temp: 36.5 C (!) 36.3 C  SpO2: 100% 99%    Last Pain:  Vitals:   12/08/22 1303  TempSrc: Axillary  PainSc:                  Tressie Stalker

## 2022-12-08 NOTE — H&P (Signed)
Cory James is an 41 y.o. male.   Chief Complaint: UC HPI: Cory James is a 41 y.o. male with past medical history of ulcerative colitis and acoustic neuroma, who presents for follow up of ulcerative colitis.   Patient reports having 1 BM per day while on Rinvoq. The patient denies having any nausea, vomiting, fever, chills, hematochezia, melena, hematemesis, abdominal distention, abdominal pain, diarrhea, jaundice, pruritus or weight loss.   Past Medical History:  Diagnosis Date   Acute midline thoracic back pain 12/26/2019   Adult acne 12/26/2019   Allergy    seasonal   Brain tumor (benign) ([place])    Colitis, ulcerative ([place])    Pancreatitis     Past Surgical History:  Procedure Laterality Date   BRAIN SURGERY     COLONOSCOPY WITH PROPOFOL N/A 02/18/2020   Procedure: COLONOSCOPY WITH PROPOFOL;  Surgeon: Rogene Houston, MD;  [facility];  Service: Endoscopy;  Laterality: N/A;    Family History  Problem Relation Age of Onset   Hypertension Mother    Diabetes Mother    Healthy Father    Healthy Sister    Social History:  reports that he has been smoking e-cigarettes and cigarettes. He has a 9.00 pack-year smoking history. He has never used smokeless tobacco. He reports that he does not currently use alcohol. He reports that he does not currently use drugs.  Allergies: No Known Allergies  Medications Prior to Admission  Medication Sig Dispense Refill   Aspirin-Salicylamide-Caffeine (BC FAST PAIN RELIEF) 650-195-33.3 MG PACK Take 1 packet by mouth Cory as needed (headaches/pain.).     Sodium Chloride-Xylitol (XLEAR SINUS CARE SPRAY NA) Place 1 spray into the nose Cory as needed (Congestion).     Upadacitinib ER (RINVOQ) 30 MG TB24 Take 1 tablet by mouth Cory at 6 (six) AM. 90 tablet 3   Vitamin D, Ergocalciferol, (DRISDOL) 1.25 MG (50000 UNIT) CAPS capsule Take 1 capsule (50,000 Units total) by mouth every 7 (seven) days for 12 doses. 12 capsule 0    levocetirizine (XYZAL) 5 MG tablet Take 1 tablet (5 mg total) by mouth every evening. 90 tablet 0   pseudoephedrine (SUDAFED) 60 MG tablet Take 1 tablet (60 mg total) by mouth every 8 (eight) hours as needed for congestion. 30 tablet 0    No results found for this or any previous visit (from the past 48 hour(s)). No results found.  Review of Systems  All other systems reviewed and are negative.   Blood pressure 137/85, pulse 67, temperature 97.7 F (36.5 C), temperature source Oral, resp. rate 19, height 5' 10"  (1.778 m), weight 88.5 kg, SpO2 100 %. Physical Exam  GENERAL: The patient is AO x3, in no acute distress. HEENT: Head is normocephalic and atraumatic. EOMI are intact. Mouth is well hydrated and without lesions. NECK: Supple. No masses LUNGS: Clear to auscultation. No presence of rhonchi/wheezing/rales. Adequate chest expansion HEART: RRR, normal s1 and s2. ABDOMEN: Soft, nontender, no guarding, no peritoneal signs, and nondistended. BS +. No masses. EXTREMITIES: Without any cyanosis, clubbing, rash, lesions or edema. NEUROLOGIC: AOx3, no focal motor deficit. SKIN: no jaundice, no rashes  Assessment/Plan Cory James is a 41 y.o. male with past medical history of ulcerative colitis and acoustic neuroma, who presents for follow up of ulcerative colitis.  Will proceed with colonoscopy.  Harvel Quale, MD 12/08/2022, 11:49 AM

## 2022-12-08 NOTE — Op Note (Signed)
[facility] Patient Name: Cory James Procedure Date: 12/08/2022 11:35 AM MRN: [id number] Date of Birth: [birth year] Attending MD: Maylon Peppers , , [id number] CSN: [id number] Age: 41 Admit Type: Outpatient Procedure:                Colonoscopy Indications:              Follow-up of chronic ulcerative pancolitis Providers:                Maylon Peppers, Hughie Closs RN, RN, Suzan Garibaldi.                            Risa Grill, Technician Referring MD:              Medicines:                Monitored Anesthesia Care Complications:            No immediate complications. Estimated Blood Loss:     Estimated blood loss: none. Procedure:                Pre-Anesthesia Assessment:                           - Prior to the procedure, a History and Physical                            was performed, and patient medications, allergies                            and sensitivities were reviewed. The patient's                            tolerance of previous anesthesia was reviewed.                           - The risks and benefits of the procedure and the                            sedation options and risks were discussed with the                            patient. All questions were answered and informed                            consent was obtained.                           - ASA Grade Assessment: II - A patient with mild                            systemic disease.                           After obtaining informed consent, the colonoscope                            was passed under direct vision. Throughout the  procedure, the patient's blood pressure, pulse, and                            oxygen saturations were monitored continuously. The                            [id number]-[id number] ([id number]) scope was introduced through                            the anus and advanced to the the terminal ileum.                            The colonoscopy was performed  without difficulty.                            The patient tolerated the procedure well. The                            quality of the bowel preparation was excellent. Scope In: 12:30:05 PM Scope Out: 12:57:35 PM Scope Withdrawal Time: 0 hours 25 minutes 21 seconds  Total Procedure Duration: 0 hours 27 minutes 30 seconds  Findings:      The perianal and digital rectal examinations were normal.      The terminal ileum appeared normal.      Inflammation was found in a continuous and circumferential pattern from       the rectum to the distal transverse colon - this extended up to 60 cm       from anal verge. This was graded as Mayo Score 1 (mild, with very mild       erythema, decreased vascular pattern), and when compared to the previous       examination, the findings are significantly improved. Imaging was       performed using white light and narrow band imaging to visualize the       mucosa. Biopsies were taken with a cold forceps for histology in the       cecum, AC, TC, DC, [place] and rectum in different jars.      An 8 mm polyp was found in the descending colon. The polyp was flat.       Polypectomy was attempted, initially using a cold snare. Polyp resection       was unsuccesful with this device as the base of the polyp could not be       cut. This intervention then required a different device and polypectomy       technique. The polyp was removed with a hot snare. Resection and       retrieval were complete.      The retroflexed view of the distal rectum and anal verge was normal and       showed no anal or rectal abnormalities. Impression:               - The examined portion of the ileum was normal.                           - Mild (Mayo Score 1) ulcerative colitis, improved  since the last examination. Biopsied.                           - One 8 mm polyp in the descending colon, removed                            with a hot snare. Resected and  retrieved.                           - The distal rectum and anal verge are normal on                            retroflexion view. Moderate Sedation:      Per Anesthesia Care Recommendation:           - Discharge patient to home (ambulatory).                           - Resume previous diet.                           - Await pathology results.                           - Repeat colonoscopy for surveillance based on                            pathology results.                           - Continue Rinvoq 30 mg qday. Procedure Code(s):        --- Professional ---                           [phone], Colonoscopy, flexible; with removal of                            tumor(s), polyp(s), or other lesion(s) by snare                            technique                           [id number], 69, Colonoscopy, flexible; with biopsy,                            single or multiple Diagnosis Code(s):        --- Professional ---                           D12.4, Benign neoplasm of descending colon                           K51.00, Ulcerative (chronic) pancolitis without                            complications CPT copyright 2022 American Medical Association. All  rights reserved. The codes documented in this report are preliminary and upon coder review may  be revised to meet current compliance requirements. Maylon Peppers, MD Maylon Peppers,  12/08/2022 1:12:07 PM This report has been signed electronically. Number of Addenda: 0

## 2022-12-08 NOTE — Transfer of Care (Signed)
Immediate Anesthesia Transfer of Care Note  Patient: Cory James  Procedure(s) Performed: COLONOSCOPY WITH PROPOFOL BIOPSY POLYPECTOMY  Patient Location: Short Stay  Anesthesia Type:General  Level of Consciousness: awake  Airway & Oxygen Therapy: Patient Spontanous Breathing  Post-op Assessment: Report given to RN  Post vital signs: Reviewed  Last Vitals:  Vitals Value Taken Time  BP    Temp    Pulse    Resp    SpO2      Last Pain:  Vitals:   12/08/22 1221  TempSrc:   PainSc: 0-No pain      Patients Stated Pain Goal: 7 (88/45/73 3448)  Complications: No notable events documented.

## 2022-12-08 NOTE — Discharge Instructions (Addendum)
You are being discharged to home.  Resume your previous diet.  We are waiting for your pathology results.  Your physician has recommended a repeat colonoscopy for surveillance based on pathology results.  Continue Rinvoq 30 mg qday.

## 2022-12-08 NOTE — Anesthesia Preprocedure Evaluation (Signed)
Anesthesia Evaluation  Patient identified by MRN, date of birth, ID band Patient awake    Reviewed: Allergy & Precautions, NPO status , Patient's Chart, lab work & pertinent test results  Airway Mallampati: III  TM Distance: >3 FB Neck ROM: Full    Dental no notable dental hx. (+) Teeth Intact   Pulmonary Current Smoker   breath sounds clear to auscultation       Cardiovascular Exercise Tolerance: Good  Rhythm:Regular Rate:Normal     Neuro/Psych    GI/Hepatic   Endo/Other    Renal/GU      Musculoskeletal   Abdominal   Peds  Hematology   Anesthesia Other Findings   Reproductive/Obstetrics                              Anesthesia Physical Anesthesia Plan  ASA: II  Anesthesia Plan: General   Post-op Pain Management:    Induction: Intravenous  PONV Risk Score and Plan: 0 and TIVA  Airway Management Planned: Natural Airway and Nasal Cannula  Additional Equipment:   Intra-op Plan:   Post-operative Plan:   Informed Consent: I have reviewed the patients History and Physical, chart, labs and discussed the procedure including the risks, benefits and alternatives for the proposed anesthesia with the patient or authorized representative who has indicated his/her understanding and acceptance.     Dental advisory given  Plan Discussed with: CRNA  Anesthesia Plan Comments:          Anesthesia Quick Evaluation

## 2022-12-09 ENCOUNTER — Telehealth: Payer: Self-pay | Admitting: Internal Medicine

## 2022-12-09 LAB — SURGICAL PATHOLOGY

## 2022-12-09 NOTE — Telephone Encounter (Signed)
Records sent

## 2022-12-09 NOTE — Telephone Encounter (Signed)
Patient called and said was referred by our office to [facility]gereon, but they also needed the records from [place] , patient said he signed a release and gave to the provider when he was in the office. They need the previous records from his surgery in [place] from 2018 to be sent over to their office before moving forward. Please contact patient back at [phone].

## 2022-12-15 DIAGNOSIS — E785 Hyperlipidemia, unspecified: Secondary | ICD-10-CM | POA: Insufficient documentation

## 2022-12-15 DIAGNOSIS — Z23 Encounter for immunization: Secondary | ICD-10-CM | POA: Insufficient documentation

## 2022-12-15 DIAGNOSIS — E559 Vitamin D deficiency, unspecified: Secondary | ICD-10-CM | POA: Insufficient documentation

## 2022-12-15 NOTE — Assessment & Plan Note (Signed)
Followed by GI.  Symptoms remain well-controlled with Rinvoq.  He is scheduled for colonoscopy on 12/20.

## 2022-12-15 NOTE — Assessment & Plan Note (Signed)
History of acoustic neuroma s/p resection on 09/28/2017 in [place], [place].  He needs to establish care with neurosurgery for routine surveillance.  A new referral has been placed today.

## 2022-12-15 NOTE — Assessment & Plan Note (Signed)
Lipid panel updated in September.  Total cholesterol 212, LDL 130.  His 10-year ASCVD risk score is 3.3%.  He was counseled on lifestyle modifications aimed at improving his cholesterol.  We specifically reviewed the Mediterranean diet.

## 2022-12-15 NOTE — Assessment & Plan Note (Signed)
HST has previously been ordered to screen for OSA due to history of snoring and daytime somnolence.  This has not been completed yet.  We will follow-up on the status of this order.

## 2022-12-15 NOTE — Assessment & Plan Note (Signed)
Noted on previous labs.  High-dose, weekly supplementation was ordered.  Repeat vitamin D level ordered today.

## 2022-12-15 NOTE — Assessment & Plan Note (Signed)
Tdap administered today (12/06/2022).

## 2022-12-21 ENCOUNTER — Encounter (HOSPITAL_COMMUNITY): Payer: Self-pay | Admitting: Gastroenterology

## 2022-12-21 NOTE — Addendum Note (Signed)
Addendum  created 12/21/22 1159 by Ollen Bowl, CRNA   Intraprocedure Staff edited

## 2022-12-27 DIAGNOSIS — D333 Benign neoplasm of cranial nerves: Secondary | ICD-10-CM | POA: Diagnosis not present

## 2022-12-27 DIAGNOSIS — R200 Anesthesia of skin: Secondary | ICD-10-CM | POA: Diagnosis not present

## 2022-12-27 DIAGNOSIS — Z6828 Body mass index (BMI) 28.0-28.9, adult: Secondary | ICD-10-CM | POA: Diagnosis not present

## 2022-12-29 ENCOUNTER — Other Ambulatory Visit (HOSPITAL_COMMUNITY): Payer: Self-pay | Admitting: Neurosurgery

## 2022-12-29 DIAGNOSIS — D333 Benign neoplasm of cranial nerves: Secondary | ICD-10-CM

## 2023-01-03 ENCOUNTER — Ambulatory Visit (HOSPITAL_COMMUNITY): Admission: RE | Admit: 2023-01-03 | Payer: 59 | Source: Ambulatory Visit

## 2023-01-03 ENCOUNTER — Encounter (HOSPITAL_COMMUNITY): Payer: Self-pay

## 2023-01-14 ENCOUNTER — Ambulatory Visit (INDEPENDENT_AMBULATORY_CARE_PROVIDER_SITE_OTHER): Payer: 59

## 2023-01-14 DIAGNOSIS — Z23 Encounter for immunization: Secondary | ICD-10-CM

## 2023-01-17 ENCOUNTER — Ambulatory Visit
Admission: EM | Admit: 2023-01-17 | Discharge: 2023-01-17 | Disposition: A | Discharge status: Home / Self Care | Payer: 59 | Attending: Internal Medicine | Admitting: Internal Medicine

## 2023-01-17 DIAGNOSIS — H6692 Otitis media, unspecified, left ear: Secondary | ICD-10-CM | POA: Diagnosis not present

## 2023-01-17 MED ORDER — FLUTICASONE PROPIONATE 50 MCG/ACT NA SUSP: 2.0000 | Freq: Every day | NASAL | 0 refills | Status: DC

## 2023-01-17 MED ORDER — AMOXICILLIN-POT CLAVULANATE 875-125 MG PO TABS: 1.0000 | ORAL_TABLET | Freq: Two times a day (BID) | ORAL | 0 refills | Status: DC

## 2023-01-17 NOTE — ED Provider Notes (Signed)
[facility]    CSN: [id number] Arrival date & time: 01/17/23  1516      History   Chief Complaint Chief Complaint  Patient presents with   Ear Drainage    HPI Cory James is a 42 y.o. male who presents with yellow matter and blood spots from L ear since last night. He denies having URI or fever. Has hearing loss from R ear due to a benign tumor that was removed. Has not been flying or been  to the mountains.     Past Medical History:  Diagnosis Date   Acute midline thoracic back pain 12/26/2019   Adult acne 12/26/2019   Allergy    seasonal   Brain tumor (benign) ([place])    Colitis, ulcerative ([place])    Pancreatitis     Patient Active Problem List   Diagnosis Date Noted   Vitamin D deficiency 12/15/2022   Hyperlipidemia 12/15/2022   Need for Tdap vaccination 12/15/2022   Encounter for annual physical exam 08/30/2022   Long term (current) use of immunosuppressive biologic 03/01/2022   History of pancreatitis 06/09/2021   Fatigue 03/26/2021   Chronic nasal congestion 03/26/2021   Snoring 03/26/2021   Screening due 03/26/2021   Gastroesophageal reflux disease 12/10/2020   Environmental and seasonal allergies 12/10/2020   Ulcerative colitis ([place]) 06/10/2020   H/O brain tumor 03/25/2020   Numbness 03/25/2020   Irritable bowel syndrome with diarrhea 11/28/2019    Past Surgical History:  Procedure Laterality Date   BIOPSY  12/08/2022   Procedure: BIOPSY;  Surgeon: Harvel Quale, MD;  [facility];  Service: Gastroenterology;;   BRAIN SURGERY     COLONOSCOPY WITH PROPOFOL N/A 02/18/2020   Procedure: COLONOSCOPY WITH PROPOFOL;  Surgeon: Rogene Houston, MD;  [facility];  Service: Endoscopy;  Laterality: N/A;   COLONOSCOPY WITH PROPOFOL N/A 12/08/2022   Procedure: COLONOSCOPY WITH PROPOFOL;  Surgeon: Harvel Quale, MD;  [facility];  Service: Gastroenterology;  Laterality: N/A;  1:30pm, asa 1-2    POLYPECTOMY  12/08/2022   Procedure: POLYPECTOMY;  Surgeon: Harvel Quale, MD;  [facility];  Service: Gastroenterology;;       Home Medications    Prior to Admission medications   Medication Sig Start Date End Date Taking? Authorizing Provider  amoxicillin-clavulanate (AUGMENTIN) 875-125 MG tablet Take 1 tablet by mouth every 12 (twelve) hours. 01/17/23  Yes Rodriguez-Southworth, Sunday Spillers, PA-C  fluticasone (FLONASE) 50 MCG/ACT nasal spray Place 2 sprays into both nostrils daily. 01/17/23  Yes Rodriguez-Southworth, Sunday Spillers, PA-C  Aspirin-Salicylamide-Caffeine (BC FAST PAIN RELIEF) 650-195-33.3 MG PACK Take 1 packet by mouth daily as needed (headaches/pain.).    [provider]  levocetirizine (XYZAL) 5 MG tablet Take 1 tablet (5 mg total) by mouth every evening. 10/02/22   Jaynee Eagles, PA-C  pseudoephedrine (SUDAFED) 60 MG tablet Take 1 tablet (60 mg total) by mouth every 8 (eight) hours as needed for congestion. 10/02/22   Jaynee Eagles, PA-C  Sodium Chloride-Xylitol Angus Seller SINUS CARE SPRAY NA) Place 1 spray into the nose daily as needed (Congestion).    [provider]  Upadacitinib ER (RINVOQ) 30 MG TB24 Take 1 tablet by mouth daily at 6 (six) AM. 03/01/22   Carlan, Chelsea L, NP  Vitamin D, Ergocalciferol, (DRISDOL) 1.25 MG (50000 UNIT) CAPS capsule Take 1 capsule (50,000 Units total) by mouth every 7 (seven) days for 12 doses. 12/07/22 02/23/23  Johnette Abraham, MD    Family History  Family History  Problem Relation Age of Onset   Hypertension Mother    Diabetes Mother    Healthy Father    Healthy Sister     Social History Social History   Tobacco Use   Smoking status: Some Days    Packs/day: 0.50    Years: 18.00    Total pack years: 9.00    Types: E-cigarettes, Cigarettes    Last attempt to quit: 12/2020    Years since quitting: 2.0   Smokeless tobacco: Never  Vaping Use   Vaping Use: Every day   Last attempt to quit: 12/20/2020    Substances: Nicotine  Substance Use Topics   Alcohol use: Not Currently    Comment: rarely   Drug use: Not Currently    Comment: occassion use     Allergies   Patient has no known allergies.   Review of Systems Review of Systems  Constitutional:  Negative for chills and fever.  HENT:  Positive for ear discharge. Negative for congestion, ear pain, postnasal drip and rhinorrhea.      Physical Exam Triage Vital Signs ED Triage Vitals [01/17/23 1748]  Enc Vitals Group     BP      Pulse      Resp      Temp      Temp src      SpO2      Weight      Height      Head Circumference      Peak Flow      Pain Score 3     Pain Loc      Pain Edu?      Excl. in [place]?    No data found.  Updated Vital Signs BP (!) 136/95 (BP Location: Right Arm)   Pulse 63   Temp 98.2 F (36.8 C) (Oral)   Resp 16   SpO2 98%   Visual Acuity Right Eye Distance:   Left Eye Distance:   Bilateral Distance:    Right Eye Near:   Left Eye Near:    Bilateral Near:     Physical Exam Vitals and nursing note reviewed.  Constitutional:      General: He is not in acute distress.    Appearance: He is not toxic-appearing.  HENT:     Right Ear: Tympanic membrane, ear canal and external ear normal.     Left Ear: Ear canal and external ear normal. Decreased hearing noted. Tympanic membrane is erythematous.     Ears:     Comments: Is hard of hearing. I do not see any perforation on the TM which is erythematous and flat. Canal is clear Eyes:     General: No scleral icterus.    Conjunctiva/sclera: Conjunctivae normal.  Pulmonary:     Effort: Pulmonary effort is normal.  Musculoskeletal:        General: Normal range of motion.     Cervical back: Neck supple.  Lymphadenopathy:     Cervical: No cervical adenopathy.  Skin:    General: Skin is warm and dry.  Neurological:     Mental Status: He is alert and oriented to person, place, and time.     Gait: Gait normal.  Psychiatric:        Mood and  Affect: Mood normal.        Behavior: Behavior normal.        Thought Content: Thought content normal.        Judgment: Judgment normal.  UC Treatments / Results  Labs (all labs ordered are listed, but only abnormal results are displayed) Labs Reviewed - No data to display  EKG   Radiology No results found.  Procedures Procedures (including critical care time)  Medications Ordered in UC Medications - No data to display  Initial Impression / Assessment and Plan / UC Course  I have reviewed the triage vital signs and the nursing notes.  L OM  I placed him on Flonase and Augmentin as noted. Needs to FU with PCP to make sure his TM is fully healed next week.     Final Clinical Impressions(s) / UC Diagnoses   Final diagnoses:  Acute otitis media, left     Discharge Instructions      Make sure to have your primary care doctor next week to make sure your ear drum is fully healed      ED Prescriptions     Medication Sig Dispense Auth. Provider   amoxicillin-clavulanate (AUGMENTIN) 875-125 MG tablet Take 1 tablet by mouth every 12 (twelve) hours. 14 tablet Rodriguez-Southworth, Naiomy Watters, PA-C   fluticasone (FLONASE) 50 MCG/ACT nasal spray Place 2 sprays into both nostrils daily. 18.2 g Rodriguez-Southworth, Sunday Spillers, PA-C      PDMP not reviewed this encounter.   Shelby Mattocks, [place] 01/17/23 1827

## 2023-01-17 NOTE — ED Triage Notes (Signed)
Pt reports yellow drainage with some blood spots in left ear since lats night.

## 2023-01-17 NOTE — Discharge Instructions (Signed)
Make sure to have your primary care doctor next week to make sure your ear drum is fully healed

## 2023-01-19 ENCOUNTER — Encounter (INDEPENDENT_AMBULATORY_CARE_PROVIDER_SITE_OTHER): Payer: Self-pay | Admitting: Gastroenterology

## 2023-01-25 ENCOUNTER — Ambulatory Visit (HOSPITAL_COMMUNITY)
Admission: RE | Admit: 2023-01-25 | Discharge: 2023-01-25 | Disposition: A | Discharge status: Home / Self Care | Payer: 59 | Source: Ambulatory Visit | Attending: Neurosurgery | Admitting: Neurosurgery

## 2023-01-25 DIAGNOSIS — D333 Benign neoplasm of cranial nerves: Secondary | ICD-10-CM | POA: Diagnosis not present

## 2023-01-25 DIAGNOSIS — H9190 Unspecified hearing loss, unspecified ear: Secondary | ICD-10-CM | POA: Diagnosis not present

## 2023-01-25 DIAGNOSIS — R200 Anesthesia of skin: Secondary | ICD-10-CM | POA: Diagnosis not present

## 2023-01-25 MED ORDER — GADOBUTROL 1 MMOL/ML IV SOLN
10.0000 mL | Freq: Once | INTRAVENOUS | Status: AC | PRN
  Administered 2023-01-25: 10 mL via INTRAVENOUS

## 2023-02-01 ENCOUNTER — Encounter: Payer: Self-pay | Admitting: Internal Medicine

## 2023-02-01 ENCOUNTER — Other Ambulatory Visit: Payer: Self-pay | Admitting: Internal Medicine

## 2023-02-01 ENCOUNTER — Ambulatory Visit (INDEPENDENT_AMBULATORY_CARE_PROVIDER_SITE_OTHER): Payer: 59 | Admitting: Internal Medicine

## 2023-02-01 VITALS — BP 138/80 | HR 90 | Ht 70.0 in | Wt 197.4 lb

## 2023-02-01 DIAGNOSIS — E559 Vitamin D deficiency, unspecified: Secondary | ICD-10-CM

## 2023-02-01 DIAGNOSIS — D333 Benign neoplasm of cranial nerves: Secondary | ICD-10-CM | POA: Diagnosis not present

## 2023-02-01 DIAGNOSIS — H6642 Suppurative otitis media, unspecified, left ear: Secondary | ICD-10-CM | POA: Insufficient documentation

## 2023-02-01 DIAGNOSIS — H7092 Unspecified mastoiditis, left ear: Secondary | ICD-10-CM

## 2023-02-01 DIAGNOSIS — Z6828 Body mass index (BMI) 28.0-28.9, adult: Secondary | ICD-10-CM | POA: Diagnosis not present

## 2023-02-01 MED ORDER — OFLOXACIN 0.3 % OT SOLN: 10.0000 [drp] | Freq: Two times a day (BID) | OTIC | 0 refills | Status: DC

## 2023-02-01 NOTE — Assessment & Plan Note (Addendum)
Presenting today for ER follow-up after an urgent care visit on 1/29 for purulent, bloody material draining from his left ear.  Treated for AOM with Augmentin.  He continues to have purulent material draining from the left ear as well as dull discomfort behind the left ear.  MRI brain from 2/6 concerning for mildly complicated left mastoid effusion. -I have prescribed ofloxacin otic drops x 14 days for treatment of suppurative otitis media with complicated mastoid effusion. -Contacted by neurosurgery earlier today (Dr. Christella Noa) who is in agreement with urgent ENT referral.

## 2023-02-01 NOTE — Progress Notes (Signed)
Established Patient Office Visit  Subjective   Patient ID: Cory James, male    DOB: 22-May-1981  Age: 42 y.o. MRN: [id number]  Chief Complaint  Patient presents with   Hospitalization Follow-up    ER follow up    Cory James presents today for ER follow-up.  He presented to urgent care on 1/29 endorsing purulent, bloody material draining from his left ear.  He was diagnosed with acute otitis media and treated with Augmentin/Flonase.  In the interim he has establish care with neurosurgery (Dr. Christella Noa).  MRI brain was completed on 2/6 and was concerning for mildly complicated left mastoid effusion.  ENT referral recommended.  There have otherwise been no acute interval events.  Cory James states that he completed his antibiotic course as prescribed.  He continues to have yellow material draining from his ear and dull discomfort behind his left ear.  He denies fever/chills and is otherwise asymptomatic.  He has no additional concerns to discuss today.  Past Medical History:  Diagnosis Date   Acute midline thoracic back pain 12/26/2019   Adult acne 12/26/2019   Allergy    seasonal   Brain tumor (benign) ([place])    Colitis, ulcerative ([place])    Pancreatitis    Past Surgical History:  Procedure Laterality Date   BIOPSY  12/08/2022   Procedure: BIOPSY;  Surgeon: Harvel Quale, MD;  [facility];  Service: Gastroenterology;;   BRAIN SURGERY     COLONOSCOPY WITH PROPOFOL N/A 02/18/2020   Procedure: COLONOSCOPY WITH PROPOFOL;  Surgeon: Rogene Houston, MD;  [facility];  Service: Endoscopy;  Laterality: N/A;   COLONOSCOPY WITH PROPOFOL N/A 12/08/2022   Procedure: COLONOSCOPY WITH PROPOFOL;  Surgeon: Harvel Quale, MD;  [facility];  Service: Gastroenterology;  Laterality: N/A;  1:30pm, asa 1-2   POLYPECTOMY  12/08/2022   Procedure: POLYPECTOMY;  Surgeon: Montez Morita, Quillian Quince, MD;  [facility];  Service: Gastroenterology;;    Social History   Tobacco Use   Smoking status: Some Days    Packs/day: 0.50    Years: 18.00    Total pack years: 9.00    Types: E-cigarettes, Cigarettes    Last attempt to quit: 12/2020    Years since quitting: 2.1   Smokeless tobacco: Never  Vaping Use   Vaping Use: Every day   Last attempt to quit: 12/20/2020   Substances: Nicotine  Substance Use Topics   Alcohol use: Not Currently    Comment: rarely   Drug use: Not Currently    Comment: occassion use   Family History  Problem Relation Age of Onset   Hypertension Mother    Diabetes Mother    Healthy Father    Healthy Sister    No Known Allergies  Review of Systems  HENT:  Positive for congestion, ear discharge (L ear) and ear pain (L ear).   All other systems reviewed and are negative.    Objective:     BP 138/80   Pulse 90   Ht 5' 10"$  (1.778 m)   Wt 197 lb 6.4 oz (89.5 kg)   SpO2 97%   BMI 28.32 kg/m  BP Readings from Last 3 Encounters:  02/01/23 138/80  01/17/23 (!) 136/95  12/08/22 102/64   Physical Exam Vitals reviewed.  Constitutional:      General: He is not in acute distress.    Appearance: Normal appearance. He is not ill-appearing.  HENT:     Head: Normocephalic  and atraumatic.     Left Ear: Tympanic membrane and external ear normal. There is no impacted cerumen.     Ears:     Comments: TM appears normal.  There is yellow, purulent material visible in the left ear canal    Nose: Congestion present. No rhinorrhea.     Mouth/Throat:     Mouth: Mucous membranes are moist.     Pharynx: Oropharynx is clear.  Eyes:     Extraocular Movements: Extraocular movements intact.     Conjunctiva/sclera: Conjunctivae normal.     Pupils: Pupils are equal, round, and reactive to light.  Cardiovascular:     Rate and Rhythm: Normal rate and regular rhythm.     Pulses: Normal pulses.     Heart sounds: Normal heart sounds. No murmur heard. Pulmonary:     Effort: Pulmonary effort is normal.     Breath  sounds: Normal breath sounds. No wheezing, rhonchi or rales.  Abdominal:     General: Abdomen is flat. Bowel sounds are normal. There is no distension.     Palpations: Abdomen is soft.     Tenderness: There is no abdominal tenderness.  Musculoskeletal:        General: No swelling or deformity. Normal range of motion.     Cervical back: Normal range of motion.  Skin:    General: Skin is warm and dry.     Capillary Refill: Capillary refill takes less than 2 seconds.  Neurological:     General: No focal deficit present.     Mental Status: He is alert and oriented to person, place, and time.     Motor: No weakness.  Psychiatric:        Mood and Affect: Mood normal.        Behavior: Behavior normal.        Thought Content: Thought content normal.   Last CBC Lab Results  Component Value Date   WBC 10.8 08/26/2022   HGB 14.5 08/26/2022   HCT 42.8 08/26/2022   MCV 83 08/26/2022   MCH 28.0 08/26/2022   RDW 13.0 08/26/2022   PLT 245 [id number]   Last metabolic panel Lab Results  Component Value Date   GLUCOSE 87 08/26/2022   NA 141 08/26/2022   K 4.4 08/26/2022   CL 104 08/26/2022   CO2 24 08/26/2022   BUN 13 08/26/2022   CREATININE 1.02 08/26/2022   EGFR 95 08/26/2022   CALCIUM 9.2 08/26/2022   PROT 7.5 08/26/2022   ALBUMIN 4.6 08/26/2022   LABGLOB 2.9 08/26/2022   AGRATIO 1.6 08/26/2022   BILITOT 0.3 08/26/2022   ALKPHOS 101 08/26/2022   AST 20 08/26/2022   ALT 26 08/26/2022   ANIONGAP 5 05/28/2021   Last lipids Lab Results  Component Value Date   CHOL 212 (H) 08/26/2022   HDL 47 08/26/2022   LDLCALC 130 (H) 08/26/2022   TRIG 195 (H) 08/26/2022   CHOLHDL 4.5 08/26/2022   Last hemoglobin A1c Lab Results  Component Value Date   HGBA1C 5.8 (H) 12/06/2022   Last thyroid functions Lab Results  Component Value Date   TSH 6.940 (H) 12/06/2022   Last vitamin D Lab Results  Component Value Date   VD25OH 9.7 (L) 12/06/2022   Last vitamin B12 and Folate Lab  Results  Component Value Date   VITAMINB12 279 08/26/2022   FOLATE 12.0 08/26/2022      The 10-year ASCVD risk score (Arnett DK, et al., 2019) is: 6%  Assessment & Plan:   Problem List Items Addressed This Visit       Suppurative otitis media of left ear - Primary    Presenting today for ER follow-up after an urgent care visit on 1/29 for purulent, bloody material draining from his left ear.  Treated for AOM with Augmentin.  He continues to have purulent material draining from the left ear as well as dull discomfort behind the left ear.  MRI brain from 2/6 concerning for mildly complicated left mastoid effusion. -I have prescribed ofloxacin otic drops x 14 days for treatment of suppurative otitis media with complicated mastoid effusion. -Contacted by neurosurgery earlier today (Dr. Christella Noa) who is in agreement with urgent ENT referral.      Return if symptoms worsen or fail to improve.   Johnette Abraham, MD

## 2023-02-01 NOTE — Patient Instructions (Signed)
It was a pleasure to see you today.  Thank you for giving [place] the opportunity to be involved in your care.  Below is a brief recap of your visit and next steps.  We will plan to see you again in June.  Summary I have prescribed ofloxacin ear drops for twice daily use for the next two weeks ENT referral placed today Follow up in June

## 2023-02-03 ENCOUNTER — Other Ambulatory Visit: Payer: Self-pay

## 2023-02-03 DIAGNOSIS — R0683 Snoring: Secondary | ICD-10-CM

## 2023-02-07 ENCOUNTER — Telehealth (INDEPENDENT_AMBULATORY_CARE_PROVIDER_SITE_OTHER): Payer: Self-pay | Admitting: *Deleted

## 2023-02-07 NOTE — Telephone Encounter (Signed)
Fax from [employer]. Rinvoq 30 mg is approved from 02/04/23 - 02/05/24.

## 2023-02-16 ENCOUNTER — Other Ambulatory Visit (INDEPENDENT_AMBULATORY_CARE_PROVIDER_SITE_OTHER): Payer: Self-pay | Admitting: Gastroenterology

## 2023-02-16 DIAGNOSIS — K5100 Ulcerative (chronic) pancolitis without complications: Secondary | ICD-10-CM

## 2023-03-02 ENCOUNTER — Telehealth: Payer: Self-pay | Admitting: Internal Medicine

## 2023-03-02 NOTE — Telephone Encounter (Signed)
Pt called in regard to sleep study. Has not heard anything in regard. Wants a call back.

## 2023-03-03 NOTE — Telephone Encounter (Signed)
LVM

## 2023-03-04 NOTE — Telephone Encounter (Signed)
Spoke with patient.

## 2023-04-04 ENCOUNTER — Ambulatory Visit (INDEPENDENT_AMBULATORY_CARE_PROVIDER_SITE_OTHER): Payer: 59 | Admitting: Gastroenterology

## 2023-04-05 DIAGNOSIS — G4730 Sleep apnea, unspecified: Secondary | ICD-10-CM | POA: Diagnosis not present

## 2023-04-18 ENCOUNTER — Ambulatory Visit (INDEPENDENT_AMBULATORY_CARE_PROVIDER_SITE_OTHER): Payer: 59 | Admitting: Gastroenterology

## 2023-04-18 ENCOUNTER — Encounter (INDEPENDENT_AMBULATORY_CARE_PROVIDER_SITE_OTHER): Payer: Self-pay | Admitting: Gastroenterology

## 2023-04-18 VITALS — BP 133/92 | HR 67 | Temp 98.0°F | Ht 70.0 in | Wt 199.3 lb

## 2023-04-18 DIAGNOSIS — K5100 Ulcerative (chronic) pancolitis without complications: Secondary | ICD-10-CM

## 2023-04-18 DIAGNOSIS — R7989 Other specified abnormal findings of blood chemistry: Secondary | ICD-10-CM

## 2023-04-18 DIAGNOSIS — Z1321 Encounter for screening for nutritional disorder: Secondary | ICD-10-CM | POA: Diagnosis not present

## 2023-04-18 NOTE — Progress Notes (Signed)
Cory James, M.D. Gastroenterology & Hepatology [facility] G[facility]enter[facility] [address] [place], [place] [address]  Primary Care Physician: Billie Lade, MD [address] [place] [address]  I will communicate my assessment and recommendations to the referring MD via EMR.  Problems: Ulcerative pancolitis on Rinvoq  History of Present Illness: Cory James is a 42 y.o. male with past medical history of ulcerative colitis and acoustic neuroma,  who presents for follow up of ulcerative colitis  The patient was last seen on 09/29/2022. At that time, the patient was scheduled for a colonoscopy with fine described below.  He was continued on Rinvoq 30 mg every day and he was referred to dermatology for annual cancer screening.  Most recent labs include vitamin D which was decreased to 9.7 on 12/06/2022  -patient was started on prescription dose vitamin D supplementation by PCP.  Labs from 08/26/2022 showed a normal CBC, CMP, increased cholesterol 212, triglycerides 195.  States he has felt well overall with Rinvoq. Reports that he may have 2-4 bowel movements per day. Stools are usually formed and no blood. The patient denies having any nausea, vomiting, fever, chills, hematochezia, melena, hematemesis, abdominal distention, abdominal pain, diarrhea, jaundice, pruritus. Has been gaining some weight, has not been exercising too much but want to start again now as winter is over.  States he has missed a few days of the medication occasionally as he went out drinking and did not want to mix the medication with alcohol.  He believes that after skipping a few days he felt some discomfort in his abdomen and his bowel movements became wiry at that time.  Last flu shot: 2023 Last pneumonia shot:never Last evaluation by dermatology: never Last zoster vaccine: received 2 in 2024. COVID-19 shot: got Moderna x2  Last Colonoscopy: 12/08/2022 [facility]  inflammation up to 60 cm from the anal verge.  A millimeter polyp in the descending colon removed with a hot snare, normal retroflexion.  Biopsies from cecum, ascending, transverse, descending, sigmoid showed presence of chronic inactive colitis.  Rectum showed hyperplastic changes.  Polyp in the sigmoid was a benign lymphoid aggregate. Patient was advised to continue Rinvoq and to repeat colonoscopy in 6 years  Past Medical History: Past Medical History:  Diagnosis Date   Acute midline thoracic back pain 12/26/2019   Adult acne 12/26/2019   Allergy    seasonal   Brain tumor (benign) ([facility])    Colitis, ulcerative ([facility])    Pancreatitis     Past Surgical History: Past Surgical History:  Procedure Laterality Date   BIOPSY  12/08/2022   Procedure: BIOPSY;  Surgeon: Dolores Frame, MD;  [facility];  Service: Gastroenterology;;   BRAIN SURGERY     COLONOSCOPY WITH PROPOFOL N/A 02/18/2020   Procedure: COLONOSCOPY WITH PROPOFOL;  Surgeon: Malissa Hippo, MD;  [facility];  Service: Endoscopy;  Laterality: N/A;   COLONOSCOPY WITH PROPOFOL N/A 12/08/2022   Procedure: COLONOSCOPY WITH PROPOFOL;  Surgeon: Dolores Frame, MD;  [facility];  Service: Gastroenterology;  Laterality: N/A;  1:30pm, asa 1-2   POLYPECTOMY  12/08/2022   Procedure: POLYPECTOMY;  Surgeon: Dolores Frame, MD;  [facility];  Service: Gastroenterology;;    Family History: Family History  Problem Relation Age of Onset   Hypertension Mother    Diabetes Mother    Healthy Father    Healthy Sister     Social History: Social  History   Tobacco Use  Smoking Status Some Days   Packs/day: 0.50   Years: 18.00   Additional pack years: 0.00   Total pack years: 9.00   Types: E-cigarettes, Cigarettes   Last attempt to quit: 12/2020   Years since quitting: 2.3  Smokeless Tobacco Never   Social History   Substance and Sexual Activity  Alcohol  Use Not Currently   Comment: rarely   Social History   Substance and Sexual Activity  Drug Use Not Currently   Comment: occassion use    Allergies: No Known Allergies  Medications: Current Outpatient Medications  Medication Sig Dispense Refill   Aspirin-Salicylamide-Caffeine (BC FAST PAIN RELIEF) 650-195-33.3 MG PACK Take 1 packet by mouth daily as needed (headaches/pain.).     fluticasone (FLONASE) 50 MCG/ACT nasal spray Place 2 sprays into both nostrils daily. 18.2 g 0   ofloxacin (FLOXIN) 0.3 % OTIC solution Place 10 drops into the left ear 2 (two) times daily. 10 mL 0   pseudoephedrine (SUDAFED) 60 MG tablet Take 1 tablet (60 mg total) by mouth every 8 (eight) hours as needed for congestion. 30 tablet 0   RINVOQ 30 MG TB24 TAKE 1 TABLET BY MOUTH 1 TIME A DAY. 30 tablet 3   Sodium Chloride-Xylitol (XLEAR SINUS CARE SPRAY NA) Place 1 spray into the nose daily as needed (Congestion).     No current facility-administered medications for this visit.    Review of Systems: GENERAL: negative for malaise, night sweats HEENT: No changes in hearing or vision, no nose bleeds or other nasal problems. NECK: Negative for lumps, goiter, pain and significant neck swelling RESPIRATORY: Negative for cough, wheezing CARDIOVASCULAR: Negative for chest pain, leg swelling, palpitations, orthopnea GI: SEE HPI MUSCULOSKELETAL: Negative for joint pain or swelling, back pain, and muscle pain. SKIN: Negative for lesions, rash PSYCH: Negative for sleep disturbance, mood disorder and recent psychosocial stressors. HEMATOLOGY Negative for prolonged bleeding, bruising easily, and swollen nodes. ENDOCRINE: Negative for cold or heat intolerance, polyuria, polydipsia and goiter. NEURO: negative for tremor, gait imbalance, syncope and seizures. The remainder of the review of systems is noncontributory.   Physical Exam: BP (!) 133/92 (BP Location: Left Arm, Patient Position: Sitting, Cuff Size: Large)    Pulse 67   Temp 98 F (36.7 C) (Temporal)   Ht 5\' 10"  (1.778 m)   Wt 199 lb 4.8 oz (90.4 kg)   BMI 28.60 kg/m  GENERAL: The patient is AO x3, in no acute distress. HEENT: Head is normocephalic and atraumatic. EOMI are intact. Mouth is well hydrated and without lesions. NECK: Supple. No masses LUNGS: Clear to auscultation. No presence of rhonchi/wheezing/rales. Adequate chest expansion HEART: RRR, normal s1 and s2. ABDOMEN: Mildly tender upon palpation of the hypogastric area, no guarding, no peritoneal signs, and nondistended. BS +. No masses. EXTREMITIES: Without any cyanosis, clubbing, rash, lesions or edema. NEUROLOGIC: AOx3, no focal motor deficit. SKIN: no jaundice, no rashes  Imaging/Labs: as above  I personally reviewed and interpreted the available labs, imaging and endoscopic files.  Impression and Plan: Cory James is a 42 y.o. male with past medical history of ulcerative colitis and acoustic neuroma,  who presents for follow up of ulcerative colitis.  Patient has presented significant improvement of his symptoms while taking Rinvoq maintenance dose.  He has not presented any side effects with this.  We discussed about the importance of taking the medication on a daily basis.  We discussed that this medication does not interact with alcohol he  should not have interruptions in his treatment.  Patient understood and agreed.  Will obtain repeat CBC, CMP, vitamin D level and iron stores.  Patient had presence of high cholesterol in previous labs, he will try to exercise more often as advised by PCP in the past.  He is almost up-to-date with his preventative measures, I advised him to ask his PCP about pneumonia vaccination.  - Check  CBC, CMP, vitamin D level and iron stores - Continue Rinvoq 30 mg qday - Ask PCP about pneumonia vaccine  All questions were answered.      Cory Blazing, MD Gastroenterology and Hepatology [facility] Gastroenterology

## 2023-04-18 NOTE — Patient Instructions (Addendum)
Perform blood workup Continue Rinvoq 30 mg qday Ask PCP about pneumonia vaccine

## 2023-04-19 ENCOUNTER — Other Ambulatory Visit: Payer: Self-pay

## 2023-04-19 DIAGNOSIS — G4733 Obstructive sleep apnea (adult) (pediatric): Secondary | ICD-10-CM

## 2023-04-21 ENCOUNTER — Ambulatory Visit (INDEPENDENT_AMBULATORY_CARE_PROVIDER_SITE_OTHER): Payer: 59 | Admitting: Pulmonary Disease

## 2023-04-21 ENCOUNTER — Encounter: Payer: Self-pay | Admitting: Pulmonary Disease

## 2023-04-21 VITALS — BP 147/91 | HR 69 | Ht 70.0 in | Wt 198.2 lb

## 2023-04-21 DIAGNOSIS — G4733 Obstructive sleep apnea (adult) (pediatric): Secondary | ICD-10-CM | POA: Diagnosis not present

## 2023-04-21 NOTE — Assessment & Plan Note (Addendum)
Home sleep test shows moderate degree of OSA with AHI 24/hour and clinically significant desaturation. He is symptomatic and has gained about 30 pounds in the last couple of years. We discussed treatment options including dental appliance and CPAP therapy.  He has a normal bite.  He would like to pursue dental appliance first.  Will refer him to an orthodontist /sleep dentist. He will need repeat sleep study after he is fitted with a dental appliance. Weight loss of 20 pounds was encouraged  Advised against medications with sedative side effects Cautioned against driving when sleepy - understanding that sleepiness will vary on a day to day basis

## 2023-04-21 NOTE — Patient Instructions (Addendum)
Yu have moderate sleep apnea - AHI 24/h You also have a delayed phase body clock problem  X Referral to sleep dentist - Dr Althea Grimmer or Dr Emeline General  Call us after to schedule repeat sleep test   Try to shift bedtime earlioer by 1/2 hr every 2 weeks  Sunlight exposure in am x 30 mins

## 2023-04-21 NOTE — Progress Notes (Signed)
Subjective:    Patient ID: Cory James, male    DOB: 12/08/1981, 42 y.o.   MRN: [id number]  HPI  42 year old man presents for evaluation of sleep disordered breathing. He reports daily at bedtime for many years between midnight and 3 AM.  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  Snoring has been noted by his wife and he occasionally wakes up with gasping and choking episodes in his sleep.  Epworth Sleepiness Scale is 11 and he reports sleepiness while sitting and reading, watching TV or lying down to rest in the afternoon  Bedtime is between midnight and 3 AM, sleep latency is minimal, he sleeps on his left side on 1 pillow, reports 2-3 nocturnal awakenings and is out of bed at 8 AM feeling tired with occasional headaches.  He has gained 30 pounds in the last few years There is no history suggestive of cataplexy, sleep paralysis or parasomnias  PMH -ulcerative colitis Right meningioma resection 2018-residual deafness in right ear, numbness or face He smoked about 10 pack years until he quit 2 years ago, now vapes   Significant tests/ events reviewed HST 03/2023 AHI 20/hour, low saturation 86%  Past Medical History:  Diagnosis Date   Acute midline thoracic back pain 12/26/2019   Adult acne 12/26/2019   Allergy    seasonal   Brain tumor (benign) (HCC)    Colitis, ulcerative (HCC)    Pancreatitis    Past Surgical History:  Procedure Laterality Date   BIOPSY  12/08/2022   Procedure: BIOPSY;  Surgeon: Dolores Frame, MD;  [facility];  Service: Gastroenterology;;   BRAIN SURGERY     COLONOSCOPY WITH PROPOFOL N/A 02/18/2020   Procedure: COLONOSCOPY WITH PROPOFOL;  Surgeon: Malissa Hippo, MD;  [facility];  Service: Endoscopy;  Laterality: N/A;   COLONOSCOPY WITH PROPOFOL N/A 12/08/2022   Procedure: COLONOSCOPY WITH PROPOFOL;  Surgeon: Dolores Frame, MD;  [facility];  Service: Gastroenterology;  Laterality: N/A;  1:30pm, asa 1-2   POLYPECTOMY  12/08/2022   Procedure: POLYPECTOMY;  Surgeon: Dolores Frame, MD;  [facility];  Service: Gastroenterology;;    No Known Allergies   Social History   Socioeconomic History   Marital status: Married    Spouse name: Jairo Ben   Number of children: 2   Years of education: 16   Highest education level: GED or equivalent  Occupational History   Not on file  Tobacco Use   Smoking status: Some Days    Packs/day: 0.50    Years: 18.00    Additional pack years: 0.00    Total pack years: 9.00    Types: E-cigarettes, Cigarettes    Last attempt to quit: 12/2020    Years since quitting: 2.3   Smokeless tobacco: Never  Vaping Use   Vaping Use: Every day   Last attempt to quit: 12/20/2020   Substances: Nicotine  Substance and Sexual Activity   Alcohol use: Not Currently    Comment: rarely   Drug use: Not Currently    Comment: occassion use   Sexual activity: Yes  Other Topics Concern   Not on file  Social History Narrative   Married for 10 years to [place]      Two Children:   2020      Vansh 42 years old  [name] 42 years old      No pets    Social Determinants of Health   Financial Resource Strain: Low Risk  (02/02/2019)   Overall Financial Resource Strain (CARDIA)    Difficulty of Paying Living Expenses: Not hard at all  Food Insecurity: No Food Insecurity (02/02/2019)   Hunger Vital Sign    Worried About Running Out of Food in the Last Year: Never true    Ran Out of Food in the Last Year: Never true  Transportation Needs: No Transportation Needs (02/02/2019)   PRAPARE - Administrator, Civil Service (Medical): No    Lack of Transportation (Non-Medical): No  Physical Activity: Inactive (02/02/2019)   Exercise Vital Sign    Days of Exercise per Week: 0 days    Minutes of Exercise per Session: 0 min  Stress: Stress Concern  Present (02/02/2019)   [employer] of Occupational Health - Occupational Stress Questionnaire    Feeling of Stress : Very much  Social Connections: Somewhat Isolated (02/02/2019)   Social Connection and Isolation Panel [NHANES]    Frequency of Communication with Friends and Family: More than three times a week    Frequency of Social Gatherings with Friends and Family: Twice a week    Attends Religious Services: Never    Database administrator or Organizations: No    Attends Banker Meetings: Never    Marital Status: Married  Catering manager Violence: Not At Risk (02/02/2019)   Humiliation, Afraid, Rape, and Kick questionnaire    Fear of Current or Ex-Partner: No    Emotionally Abused: No    Physically Abused: No    Sexually Abused: No    Family History  Problem Relation Age of Onset   Hypertension Mother    Diabetes Mother    Healthy Father    Healthy Sister      Review of Systems Shortness of breath with activity Nonproductive cough Acid heartburn Weight gain Sore throat Headaches and nasal congestion, sneezing and itching Earache Joint stiffness    Objective:   Physical Exam  Gen. Pleasant, well-nourished, in no distress ENT - no thrush, no pallor/icterus,no post nasal drip, class 2 airway, normal bite Neck: No JVD, no thyromegaly, no carotid bruits Lungs: no use of accessory muscles, no dullness to percussion, clear without rales or rhonchi  Cardiovascular: Rhythm regular, heart sounds  normal, no murmurs or gallops, no peripheral edema Musculoskeletal: No deformities, no cyanosis or clubbing        Assessment & Plan:

## 2023-04-26 DIAGNOSIS — G4733 Obstructive sleep apnea (adult) (pediatric): Secondary | ICD-10-CM | POA: Diagnosis not present

## 2023-04-26 DIAGNOSIS — H7292 Unspecified perforation of tympanic membrane, left ear: Secondary | ICD-10-CM | POA: Diagnosis not present

## 2023-04-26 DIAGNOSIS — Z86018 Personal history of other benign neoplasm: Secondary | ICD-10-CM | POA: Diagnosis not present

## 2023-04-26 DIAGNOSIS — H918X3 Other specified hearing loss, bilateral: Secondary | ICD-10-CM | POA: Diagnosis not present

## 2023-05-30 DIAGNOSIS — Z86018 Personal history of other benign neoplasm: Secondary | ICD-10-CM | POA: Diagnosis not present

## 2023-06-07 ENCOUNTER — Ambulatory Visit (INDEPENDENT_AMBULATORY_CARE_PROVIDER_SITE_OTHER): Payer: 59 | Admitting: Internal Medicine

## 2023-06-07 ENCOUNTER — Encounter: Payer: Self-pay | Admitting: Internal Medicine

## 2023-06-07 VITALS — BP 125/81 | HR 62 | Ht 70.0 in | Wt 200.8 lb

## 2023-06-07 DIAGNOSIS — H9193 Unspecified hearing loss, bilateral: Secondary | ICD-10-CM

## 2023-06-07 DIAGNOSIS — K5100 Ulcerative (chronic) pancolitis without complications: Secondary | ICD-10-CM | POA: Diagnosis not present

## 2023-06-07 DIAGNOSIS — E559 Vitamin D deficiency, unspecified: Secondary | ICD-10-CM

## 2023-06-07 DIAGNOSIS — G4733 Obstructive sleep apnea (adult) (pediatric): Secondary | ICD-10-CM | POA: Diagnosis not present

## 2023-06-07 DIAGNOSIS — H9190 Unspecified hearing loss, unspecified ear: Secondary | ICD-10-CM | POA: Insufficient documentation

## 2023-06-07 NOTE — Assessment & Plan Note (Addendum)
He has complete hearing loss in the right ear as a result of an acoustic neuroma s/p resection.  Hearing changes noted in the left ear as well.  He has establish care with audiology and will return to care for follow-up next month (7/16) as he is in the process of obtaining hearing aids.

## 2023-06-07 NOTE — Assessment & Plan Note (Signed)
Followed by gastroenterology (Dr. Levon Hedger).  Symptoms remain well-controlled with Rinvoq.

## 2023-06-07 NOTE — Assessment & Plan Note (Signed)
He has completed high-dose, weekly vitamin D supplementation but is not currently taking daily vitamin D supplementation. -Repeat vitamin D level ordered today

## 2023-06-07 NOTE — Progress Notes (Signed)
Established Patient Office Visit  Subjective   Patient ID: Cory James, male    DOB: January 07, 1981  Age: 42 y.o. MRN: [id number]  Chief Complaint  Patient presents with   Sleep Apnea    Follow up   Cory James returns to care today for routine follow-up.  He was last evaluated by me on 2/13 and was treated for acute otitis media of the left ear.  He was also referred to ENT for urgent evaluation given his neurosurgical history and evidence of complicated mastoid effusion on recent imaging.  In the interim he has been seen by gastroenterology for follow-up and has undergone OSA evaluation with pulmonology.  Recent recent sleep study has demonstrated moderate OSA.  He was evaluated by ENT on 5/7 and referred to audiology for hearing evaluation as it was discovered he had a perforated left TM.  Cory James reports feeling well today.  His acute concern is requesting to repeat his vitamin D level as he has completed high-dose, weekly vitamin D supplementation.  He will follow-up with audiology next month as he works to obtain hearing aids.  He is working with pulmonology to obtain a dental appliance.  Past Medical History:  Diagnosis Date   Acute midline thoracic back pain 12/26/2019   Adult acne 12/26/2019   Allergy    seasonal   Brain tumor (benign) (HCC)    Colitis, ulcerative (HCC)    Pancreatitis    Past Surgical History:  Procedure Laterality Date   BIOPSY  12/08/2022   Procedure: BIOPSY;  Surgeon: Dolores Frame, MD;  [facility];  Service: Gastroenterology;;   BRAIN SURGERY     COLONOSCOPY WITH PROPOFOL N/A 02/18/2020   Procedure: COLONOSCOPY WITH PROPOFOL;  Surgeon: Malissa Hippo, MD;  [facility];  Service: Endoscopy;  Laterality: N/A;   COLONOSCOPY WITH PROPOFOL N/A 12/08/2022   Procedure: COLONOSCOPY WITH PROPOFOL;  Surgeon: Dolores Frame, MD;  [facility];  Service: Gastroenterology;  Laterality: N/A;  1:30pm, asa 1-2    POLYPECTOMY  12/08/2022   Procedure: POLYPECTOMY;  Surgeon: Marguerita Merles, Reuel Boom, MD;  [facility];  Service: Gastroenterology;;   Social History   Tobacco Use   Smoking status: Some Days    Packs/day: 0.50    Years: 18.00    Additional pack years: 0.00    Total pack years: 9.00    Types: E-cigarettes, Cigarettes    Last attempt to quit: 12/2020    Years since quitting: 2.4   Smokeless tobacco: Never  Vaping Use   Vaping Use: Every day   Last attempt to quit: 12/20/2020   Substances: Nicotine  Substance Use Topics   Alcohol use: Not Currently    Comment: rarely   Drug use: Not Currently    Comment: occassion use   Family History  Problem Relation Age of Onset   Hypertension Mother    Diabetes Mother    Healthy Father    Healthy Sister    No Known Allergies  Review of Systems  Constitutional:  Negative for chills and fever.  HENT:  Negative for sore throat.   Respiratory:  Negative for cough and shortness of breath.   Cardiovascular:  Negative for chest pain, palpitations and leg swelling.  Gastrointestinal:  Negative for abdominal pain, blood in stool, constipation, diarrhea, nausea and vomiting.  Genitourinary:  Negative for dysuria and hematuria.  Musculoskeletal:  Positive for myalgias.  Skin:  Negative for itching and rash.  Neurological:  Negative for dizziness and headaches.  Psychiatric/Behavioral:  Negative for depression and suicidal ideas.      Objective:     BP 125/81   Pulse 62   Ht 5\' 10"  (1.778 m)   Wt 200 lb 12.8 oz (91.1 kg)   SpO2 97%   BMI 28.81 kg/m  BP Readings from Last 3 Encounters:  06/07/23 125/81  04/21/23 (!) 147/91  04/18/23 (!) 133/92   Physical Exam Vitals reviewed.  Constitutional:      General: He is not in acute distress.    Appearance: Normal appearance. He is not ill-appearing.  HENT:     Head: Normocephalic and atraumatic.     Right Ear: External ear normal.     Left Ear: External ear normal.      Ears:     Comments: Loss of hearing in R ear    Nose: Nose normal. No congestion or rhinorrhea.     Mouth/Throat:     Mouth: Mucous membranes are moist.     Pharynx: Oropharynx is clear.  Eyes:     Extraocular Movements: Extraocular movements intact.     Conjunctiva/sclera: Conjunctivae normal.     Pupils: Pupils are equal, round, and reactive to light.  Cardiovascular:     Rate and Rhythm: Normal rate and regular rhythm.     Pulses: Normal pulses.     Heart sounds: Normal heart sounds. No murmur heard. Pulmonary:     Effort: Pulmonary effort is normal.     Breath sounds: Normal breath sounds. No wheezing, rhonchi or rales.  Abdominal:     General: Abdomen is flat. Bowel sounds are normal. There is no distension.     Palpations: Abdomen is soft.     Tenderness: There is no abdominal tenderness.  Musculoskeletal:        General: No swelling or deformity. Normal range of motion.     Cervical back: Normal range of motion.  Skin:    General: Skin is warm and dry.     Capillary Refill: Capillary refill takes less than 2 seconds.  Neurological:     General: No focal deficit present.     Mental Status: He is alert and oriented to person, place, and time.     Motor: No weakness.  Psychiatric:        Mood and Affect: Mood normal.        Behavior: Behavior normal.        Thought Content: Thought content normal.   Last CBC Lab Results  Component Value Date   WBC 10.8 08/26/2022   HGB 14.5 08/26/2022   HCT 42.8 08/26/2022   MCV 83 08/26/2022   MCH 28.0 08/26/2022   RDW 13.0 08/26/2022   PLT 245 08/26/2022   Last metabolic panel Lab Results  Component Value Date   GLUCOSE 87 08/26/2022   NA 141 08/26/2022   K 4.4 08/26/2022   CL 104 08/26/2022   CO2 24 08/26/2022   BUN 13 08/26/2022   CREATININE 1.02 08/26/2022   EGFR 95 08/26/2022   CALCIUM 9.2 08/26/2022   PROT 7.5 08/26/2022   ALBUMIN 4.6 08/26/2022   LABGLOB 2.9 08/26/2022   AGRATIO 1.6 08/26/2022   BILITOT 0.3  08/26/2022   ALKPHOS 101 08/26/2022   AST 20 08/26/2022   ALT 26 08/26/2022   ANIONGAP 5 05/28/2021   Last lipids Lab Results  Component Value Date   CHOL 212 (H) 08/26/2022   HDL 47 08/26/2022   LDLCALC 130 (H) 08/26/2022  TRIG 195 (H) 08/26/2022   CHOLHDL 4.5 08/26/2022   Last hemoglobin A1c Lab Results  Component Value Date   HGBA1C 5.8 (H) 12/06/2022   Last thyroid functions Lab Results  Component Value Date   TSH 6.940 (H) 12/06/2022   Last vitamin D Lab Results  Component Value Date   VD25OH 9.7 (L) 12/06/2022   Last vitamin B12 and Folate Lab Results  Component Value Date   VITAMINB12 279 08/26/2022   FOLATE 12.0 08/26/2022   The 10-year ASCVD risk score (Arnett DK, et al., 2019) is: 5%    Assessment & Plan:   Problem List Items Addressed This Visit       OSA (obstructive sleep apnea)    Moderate OSA noted on recent sleep study.  Followed by pulmonology (Dr. Vassie Loll).  He would like to try using a dental appliance first.  He has been referred to an orthodontist/sleep dentist but has yet to establish care.      Ulcerative colitis (HCC)    Followed by gastroenterology (Dr. Levon Hedger).  Symptoms remain well-controlled with Rinvoq.       Hearing loss    He has complete hearing loss in the right ear as a result of an acoustic neuroma s/p resection.  Hearing changes noted in the left ear as well.  He has establish care with audiology and will return to care for follow-up next month (7/16) as he is in the process of obtaining hearing aids.      Vitamin D deficiency - Primary    He has completed high-dose, weekly vitamin D supplementation but is not currently taking daily vitamin D supplementation. -Repeat vitamin D level ordered today       Return in about 3 months (around 09/07/2023) for CPE.    Billie Lade, MD

## 2023-06-07 NOTE — Patient Instructions (Signed)
It was a pleasure to see you today.  Thank you for giving [place] the opportunity to be involved in your care.  Below is a brief recap of your visit and next steps.  We will plan to see you again in 3 months.  Summary No medication changes today Repeat vitamin D level ordered Follow up in 3 months for annual exam

## 2023-06-07 NOTE — Assessment & Plan Note (Signed)
Moderate OSA noted on recent sleep study.  Followed by pulmonology (Dr. Vassie Loll).  He would like to try using a dental appliance first.  He has been referred to an orthodontist/sleep dentist but has yet to establish care.

## 2023-06-08 ENCOUNTER — Other Ambulatory Visit (INDEPENDENT_AMBULATORY_CARE_PROVIDER_SITE_OTHER): Payer: Self-pay | Admitting: Gastroenterology

## 2023-06-08 ENCOUNTER — Other Ambulatory Visit: Payer: Self-pay | Admitting: Internal Medicine

## 2023-06-08 DIAGNOSIS — K5100 Ulcerative (chronic) pancolitis without complications: Secondary | ICD-10-CM

## 2023-06-08 DIAGNOSIS — E559 Vitamin D deficiency, unspecified: Secondary | ICD-10-CM

## 2023-06-08 LAB — VITAMIN D 25 HYDROXY (VIT D DEFICIENCY, FRACTURES): Vit D, 25-Hydroxy: 11.7 ng/mL — ABNORMAL LOW (ref 30.0–100.0)

## 2023-06-08 MED ORDER — VITAMIN D (ERGOCALCIFEROL) 1.25 MG (50000 UNIT) PO CAPS: 50000.0000 [IU] | ORAL_CAPSULE | ORAL | 0 refills | Status: AC

## 2023-07-12 ENCOUNTER — Ambulatory Visit: Payer: 59 | Admitting: Dermatology

## 2023-09-15 ENCOUNTER — Encounter: Payer: 59 | Admitting: Internal Medicine

## 2023-10-24 ENCOUNTER — Encounter (INDEPENDENT_AMBULATORY_CARE_PROVIDER_SITE_OTHER): Payer: Self-pay | Admitting: Gastroenterology

## 2023-10-24 ENCOUNTER — Encounter (INDEPENDENT_AMBULATORY_CARE_PROVIDER_SITE_OTHER): Payer: Self-pay

## 2023-10-24 ENCOUNTER — Ambulatory Visit (INDEPENDENT_AMBULATORY_CARE_PROVIDER_SITE_OTHER): Payer: 59 | Admitting: Gastroenterology

## 2023-10-24 VITALS — BP 150/111 | HR 74 | Temp 97.5°F | Ht 70.0 in | Wt 197.3 lb

## 2023-10-24 DIAGNOSIS — Z1159 Encounter for screening for other viral diseases: Secondary | ICD-10-CM

## 2023-10-24 DIAGNOSIS — K5100 Ulcerative (chronic) pancolitis without complications: Secondary | ICD-10-CM | POA: Diagnosis not present

## 2023-10-24 DIAGNOSIS — R140 Abdominal distension (gaseous): Secondary | ICD-10-CM

## 2023-10-24 DIAGNOSIS — E559 Vitamin D deficiency, unspecified: Secondary | ICD-10-CM

## 2023-10-24 DIAGNOSIS — Z111 Encounter for screening for respiratory tuberculosis: Secondary | ICD-10-CM

## 2023-10-24 NOTE — Progress Notes (Signed)
[name], M.D. Gastroenterology & Hepatology [facility] [address] [place], [place] [address]  Primary Care Physician: Billie Lade, MD [address] [place] [address]  I will communicate my assessment and recommendations to the referring MD via EMR.  Problems: Ulcerative pancolitis on Rinvoq  History of Present Illness: Cory James is a 42 y.o. male with past medical history of OSA, ulcerative colitis and acoustic neuroma,  who presents for follow up of ulcerative colitis   The patient was last seen on 04/18/23. At that time, the patient was continued on Rinvoq to 30 mg every day.  Also advised to get pneumonia vaccine.  Blood workup was ordered but he did not perform this testing.  Vitamin D levels on 06/07/2023 were low at 11.7.  PCP sent a higher dose of vitamin D.  States he is doing relatively well. States he has noticed that when he eats spicy food or drinks significant amount liquor, he will have bloating, nausea without vomiting and would have large amount of soft bowel movements without melena and hematochezia. This is happening very rarely. He states, that on average he is having 2-3 Bms per day which is formed. He used to have 1 BM per day.  States that once a month he has felt some abdominal pain in his lower abdomen, which he describes as pressure.  Has presented some acne in his back.  No acne in is face so he is not very concerned about this.  The patient denies having any vomiting, fever, chills, hematochezia, melena, hematemesis, jaundice, pruritus or weight loss.  Last flu shot: 2023 Last pneumonia shot:never Last evaluation by dermatology: never Last zoster vaccine: received 2 in 2024. COVID-19 shot: got Moderna x2  Last Colonoscopy: 12/08/2022 [facility] inflammation up to 60 cm from the anal verge.  A millimeter polyp in the descending colon removed with a hot snare, normal retroflexion.   Biopsies from cecum, ascending, transverse, descending, sigmoid showed presence of chronic inactive colitis.  Rectum showed hyperplastic changes.  Polyp in the sigmoid was a benign lymphoid aggregate. Patient was advised to continue Rinvoq and to repeat colonoscopy in 6 years  Past Medical History: Past Medical History:  Diagnosis Date   Acute midline thoracic back pain 12/26/2019   Adult acne 12/26/2019   Allergy    seasonal   Brain tumor (benign) (HCC)    Colitis, ulcerative (HCC)    Pancreatitis     Past Surgical History: Past Surgical History:  Procedure Laterality Date   BIOPSY  12/08/2022   Procedure: BIOPSY;  Surgeon: Dolores Frame, MD;  [facility];  Service: Gastroenterology;;   BRAIN SURGERY     COLONOSCOPY WITH PROPOFOL N/A 02/18/2020   Procedure: COLONOSCOPY WITH PROPOFOL;  Surgeon: Malissa Hippo, MD;  [facility];  Service: Endoscopy;  Laterality: N/A;   COLONOSCOPY WITH PROPOFOL N/A 12/08/2022   Procedure: COLONOSCOPY WITH PROPOFOL;  Surgeon: Dolores Frame, MD;  [facility];  Service: Gastroenterology;  Laterality: N/A;  1:30pm, asa 1-2   POLYPECTOMY  12/08/2022   Procedure: POLYPECTOMY;  Surgeon: Dolores Frame, MD;  [facility];  Service: Gastroenterology;;    Family History: Family History  Problem Relation Age of Onset   Hypertension Mother    Diabetes Mother    Healthy Father    Healthy Sister     Social History: Social History   Tobacco Use  Smoking Status  Some Days   Current packs/day: 0.50   Average packs/day: 0.5 packs/day for 18.0 years (9.0 ttl pk-yrs)   Types: E-cigarettes, Cigarettes   Last attempt to quit: 12/2020  Smokeless Tobacco Never   Social History   Substance and Sexual Activity  Alcohol Use Not Currently   Comment: rarely   Social History   Substance and Sexual Activity  Drug Use Not Currently   Comment: occassion use    Allergies: No  Known Allergies  Medications: Current Outpatient Medications  Medication Sig Dispense Refill   Aspirin-Salicylamide-Caffeine (BC FAST PAIN RELIEF) 650-195-33.3 MG PACK Take 1 packet by mouth daily as needed (headaches/pain.).     Upadacitinib ER (RINVOQ) 30 MG TB24 TAKE 1 TABLET BY MOUTH 1 TIME A DAY. 30 tablet 5   No current facility-administered medications for this visit.    Review of Systems: GENERAL: negative for malaise, night sweats HEENT: No changes in hearing or vision, no nose bleeds or other nasal problems. NECK: Negative for lumps, goiter, pain and significant neck swelling RESPIRATORY: Negative for cough, wheezing CARDIOVASCULAR: Negative for chest pain, leg swelling, palpitations, orthopnea GI: SEE HPI MUSCULOSKELETAL: Negative for joint pain or swelling, back pain, and muscle pain. SKIN: Negative for lesions, rash PSYCH: Negative for sleep disturbance, mood disorder and recent psychosocial stressors. HEMATOLOGY Negative for prolonged bleeding, bruising easily, and swollen nodes. ENDOCRINE: Negative for cold or heat intolerance, polyuria, polydipsia and goiter. NEURO: negative for tremor, gait imbalance, syncope and seizures. The remainder of the review of systems is noncontributory.   Physical Exam: BP (!) 150/111 (BP Location: Right Arm, Patient Position: Sitting, Cuff Size: Large)   Pulse 74   Temp (!) 97.5 F (36.4 C) (Temporal)   Ht 5\' 10"  (1.778 m)   Wt 197 lb 4.8 oz (89.5 kg)   BMI 28.31 kg/m  GENERAL: The patient is AO x3, in no acute distress. HEENT: Head is normocephalic and atraumatic. EOMI are intact. Mouth is well hydrated and without lesions. NECK: Supple. No masses LUNGS: Clear to auscultation. No presence of rhonchi/wheezing/rales. Adequate chest expansion HEART: RRR, normal s1 and s2. ABDOMEN: Soft, nontender, no guarding, no peritoneal signs, and nondistended. BS +. No masses. EXTREMITIES: Without any cyanosis, clubbing, rash, lesions or  edema. NEUROLOGIC: AOx3, no focal motor deficit. SKIN: no jaundice, no rashes  Imaging/Labs: as above  I personally reviewed and interpreted the available labs, imaging and endoscopic files.  Impression and Plan: Cory James is a 42 y.o. male with past medical history of OSA, ulcerative colitis and acoustic neuroma,  who presents for follow up of ulcerative colitis.  Patient has presented a history of chronic pancolitis, currently on management with Rinvoq, on maintenance dosage.  He has achieved significant symptom improvement and had endoscopic response based on most recent colonoscopy.  He is still presenting some occasional abdominal complaints, which could be related to some degree of coexistent IBS.  We discussed the possibility of evaluating if he has achieved further endoscopic improvement with a flexible sigmoidoscopy, which will be scheduled in late December.  He will be continued on same dose of Rinvoq.  I advised him to take IBgard as needed if he presented recurrent abdominal bloating.  In terms of his preventative measures, he is due for flu and pneumonia vaccination, which he should ask his PCP about.  I will also send a referral for dermatology for annual cancer screening.  -Schedule flexible sigmoidoscopy in late December -Continue Rinvoq 30 mg qday -Start IBGard 1 tablet every 8-12 hours  as needed for bloating . Can use peppermint tea as well. -Ask PCP about flu and pneumonia shot -Dermatology referral  All questions were answered.      [name], MD Gastroenterology and Hepatology [facility] Gastroenterology

## 2023-10-24 NOTE — Patient Instructions (Addendum)
Schedule flexible sigmoidoscopy in late December Continue Rinvoq 30 mg qday Start IBGard 1 tablet every 8-12 hours as needed for bloating . Can use peppermint tea as well. Ask PCP about flu and pneumonia shot Dermatology referral

## 2023-12-02 ENCOUNTER — Other Ambulatory Visit (INDEPENDENT_AMBULATORY_CARE_PROVIDER_SITE_OTHER): Payer: Self-pay | Admitting: Gastroenterology

## 2023-12-02 DIAGNOSIS — K5100 Ulcerative (chronic) pancolitis without complications: Secondary | ICD-10-CM

## 2023-12-02 NOTE — Telephone Encounter (Signed)
Pt last seen by Dr. Levon Hedger 10/24/23 for ulcerative pancolitis. Note states to continue rinvoq 30mg  daily

## 2023-12-12 ENCOUNTER — Other Ambulatory Visit (HOSPITAL_COMMUNITY): Payer: 59

## 2023-12-16 ENCOUNTER — Ambulatory Visit (HOSPITAL_COMMUNITY): Admission: RE | Admit: 2023-12-16 | Payer: 59 | Source: Home / Self Care | Admitting: Gastroenterology

## 2023-12-16 ENCOUNTER — Encounter (HOSPITAL_COMMUNITY): Payer: Self-pay | Admitting: Certified Registered"

## 2023-12-16 ENCOUNTER — Encounter (HOSPITAL_COMMUNITY): Admission: RE | Payer: Self-pay | Source: Home / Self Care

## 2023-12-16 ENCOUNTER — Telehealth (INDEPENDENT_AMBULATORY_CARE_PROVIDER_SITE_OTHER): Payer: Self-pay | Admitting: Gastroenterology

## 2023-12-16 SURGERY — FLEXIBLE SIGMOIDOSCOPY
Anesthesia: Monitor Anesthesia Care

## 2023-12-16 NOTE — Telephone Encounter (Signed)
Per Endo-this guy was scheduled for today and just called to cancel he is out of town and forgot needs to reschedule   Flex sig with Levon Hedger

## 2023-12-19 NOTE — Telephone Encounter (Signed)
Left message to return call 

## 2023-12-20 NOTE — Telephone Encounter (Signed)
Left message to return call.  Will send letter.

## 2024-01-09 ENCOUNTER — Encounter (INDEPENDENT_AMBULATORY_CARE_PROVIDER_SITE_OTHER): Payer: Self-pay

## 2024-01-09 ENCOUNTER — Telehealth (INDEPENDENT_AMBULATORY_CARE_PROVIDER_SITE_OTHER): Payer: Self-pay

## 2024-01-09 NOTE — Telephone Encounter (Signed)
I received a Prior Authorization from [facility] stating the patient needed authorization on Rinvoq 30 mg. Per Dr. Levon Hedger, make patient aware we can not complete his authorization until he has completed the labs we had ordered on him on 10/23/2024 (TB,HEP B,CBC,CMP), as the authorization requires this information in order to complete the prior authorization.

## 2024-01-10 NOTE — Telephone Encounter (Signed)
I spoke with the patient and made him aware in order to prevent any delay in his Rinvoq treatment, he will need to complete the labs Dr. Levon Hedger ordered on 10/24/2023. He will come by the office and pick up the lab orders to be done at [facility] some time this week. He is aware the orders are at the front office.

## 2024-01-11 DIAGNOSIS — Z1159 Encounter for screening for other viral diseases: Secondary | ICD-10-CM | POA: Diagnosis not present

## 2024-01-11 DIAGNOSIS — E559 Vitamin D deficiency, unspecified: Secondary | ICD-10-CM | POA: Diagnosis not present

## 2024-01-11 DIAGNOSIS — Z111 Encounter for screening for respiratory tuberculosis: Secondary | ICD-10-CM | POA: Diagnosis not present

## 2024-01-11 NOTE — Telephone Encounter (Signed)
I spoke with the patient made him aware we can not complete his prior auth until he does the lab work. Patient says he will come by the office and get the lab orders and have these drawn soon.

## 2024-01-12 NOTE — Telephone Encounter (Signed)
Labs drawn, still awaiting results of the TB and HBSAG.

## 2024-01-12 NOTE — Telephone Encounter (Signed)
Labs drawn, awaiting Tb, and HBSAG to result.

## 2024-01-13 LAB — CBC WITH DIFFERENTIAL/PLATELET
Absolute Lymphocytes: 3650 {cells}/uL (ref 850–3900)
Absolute Monocytes: 450 {cells}/uL (ref 200–950)
Basophils Absolute: 40 {cells}/uL (ref 0–200)
Basophils Relative: 0.5 %
Eosinophils Absolute: 63 {cells}/uL (ref 15–500)
Eosinophils Relative: 0.8 %
HCT: 44.6 % (ref 38.5–50.0)
Hemoglobin: 14.7 g/dL (ref 13.2–17.1)
MCH: 27 pg (ref 27.0–33.0)
MCHC: 33 g/dL (ref 32.0–36.0)
MCV: 82 fL (ref 80.0–100.0)
MPV: 11.6 fL (ref 7.5–12.5)
Monocytes Relative: 5.7 %
Neutro Abs: 3697 {cells}/uL (ref 1500–7800)
Neutrophils Relative %: 46.8 %
Platelets: 241 10*3/uL (ref 140–400)
RBC: 5.44 10*6/uL (ref 4.20–5.80)
RDW: 13.7 % (ref 11.0–15.0)
Total Lymphocyte: 46.2 %
WBC: 7.9 10*3/uL (ref 3.8–10.8)

## 2024-01-13 LAB — QUANTIFERON-TB GOLD PLUS
Mitogen-NIL: >10 [IU]/mL
NIL: 0.03 [IU]/mL
QuantiFERON-TB Gold Plus: NEGATIVE
TB1-NIL: 0.01 [IU]/mL
TB2-NIL: 0.01 [IU]/mL

## 2024-01-13 LAB — HEPATITIS B SURFACE ANTIGEN: Hepatitis B Surface Ag: NONREACTIVE

## 2024-01-13 LAB — LIPID PANEL
Cholesterol: 278 mg/dL — ABNORMAL HIGH (ref <200)
HDL: 64 mg/dL (ref >=40)
LDL Cholesterol (Calc): 173 mg/dL — ABNORMAL HIGH
Non-HDL Cholesterol (Calc): 214 mg/dL — ABNORMAL HIGH (ref <130)
Total CHOL/HDL Ratio: 4.3 (calc) (ref <5.0)
Triglycerides: 248 mg/dL — ABNORMAL HIGH (ref <150)

## 2024-01-13 LAB — COMPREHENSIVE METABOLIC PANEL
AG Ratio: 1.6 (calc) (ref 1.0–2.5)
ALT: 58 U/L — ABNORMAL HIGH (ref 9–46)
AST: 29 U/L (ref 10–40)
Albumin: 4.5 g/dL (ref 3.6–5.1)
Alkaline phosphatase (APISO): 82 U/L (ref 36–130)
BUN: 19 mg/dL (ref 7–25)
CO2: 27 mmol/L (ref 20–32)
Calcium: 9 mg/dL (ref 8.6–10.3)
Chloride: 102 mmol/L (ref 98–110)
Creat: 0.94 mg/dL (ref 0.60–1.29)
Globulin: 2.9 g/dL (ref 1.9–3.7)
Glucose, Bld: 111 mg/dL — ABNORMAL HIGH (ref 65–99)
Potassium: 4 mmol/L (ref 3.5–5.3)
Sodium: 138 mmol/L (ref 135–146)
Total Bilirubin: 0.7 mg/dL (ref 0.2–1.2)
Total Protein: 7.4 g/dL (ref 6.1–8.1)

## 2024-01-13 LAB — IRON,TIBC AND FERRITIN PANEL
%SAT: 39 % (ref 20–48)
Ferritin: 57 ng/mL (ref 38–380)
Iron: 124 ug/dL (ref 50–180)
TIBC: 319 ug/dL (ref 250–425)

## 2024-01-13 LAB — C-REACTIVE PROTEIN: CRP: <3 mg/L (ref <8.0)

## 2024-01-13 LAB — VITAMIN D 25 HYDROXY (VIT D DEFICIENCY, FRACTURES): Vit D, 25-Hydroxy: 13 ng/mL — ABNORMAL LOW (ref 30–100)

## 2024-01-14 ENCOUNTER — Other Ambulatory Visit: Payer: Self-pay | Admitting: Gastroenterology

## 2024-01-14 DIAGNOSIS — R7989 Other specified abnormal findings of blood chemistry: Secondary | ICD-10-CM

## 2024-01-14 MED ORDER — ERGOCALCIFEROL 50 MCG (2000 UT) PO CAPS: 1.0000 | ORAL_CAPSULE | Freq: Every day | ORAL | 3 refills | Status: DC

## 2024-01-16 NOTE — Telephone Encounter (Signed)
Awaiting Medical records to be pulled so we can add to the PA and fax.

## 2024-01-16 NOTE — Telephone Encounter (Signed)
-------  Fax Transmission Report-------  To:               Recipient at [phone] Subject:          M. Allena Katz PA for Rinvoq Result:           The transmission was successful. Explanation:      All Pages Ok Pages Sent:       37 Connect Time:     4 minutes, 44 seconds Transmit Time:    01/16/2024 15:55 Transfer Rate:    9600 Status Code:      0000 Retry Count:      1 Job Id:           [id number] Unique Id:        [id number] Fax Line:         93 Fax Server:       [id number]

## 2024-01-16 NOTE — Progress Notes (Signed)
-------  Fax Transmission Report-------  To:               Recipient at [phone] Subject:          M. Allena Katz PA for Rinvoq Result:           The transmission was successful. Explanation:      All Pages Ok Pages Sent:       37 Connect Time:     4 minutes, 44 seconds Transmit Time:    01/16/2024 15:55 Transfer Rate:    9600 Status Code:      0000 Retry Count:      1 Job Id:           [id number] Unique Id:        [id number] Fax Line:         93 Fax Server:       [id number]

## 2024-01-16 NOTE — Telephone Encounter (Signed)
[facility].

## 2024-01-16 NOTE — Telephone Encounter (Signed)
Awaiting medical records to send with the PA.

## 2024-01-17 ENCOUNTER — Telehealth (INDEPENDENT_AMBULATORY_CARE_PROVIDER_SITE_OTHER): Payer: Self-pay

## 2024-01-17 NOTE — Telephone Encounter (Signed)
Per [facility] [employer] Patient Rinvoq 30 mg is approved from 01/16/2024-01/15/2025.

## 2024-01-17 NOTE — Telephone Encounter (Signed)
Records requested from Medical Records release here at the office. I will fax the requested information to the plan once this is back.

## 2024-01-18 ENCOUNTER — Ambulatory Visit (INDEPENDENT_AMBULATORY_CARE_PROVIDER_SITE_OTHER): Payer: 59 | Admitting: Internal Medicine

## 2024-01-18 ENCOUNTER — Encounter: Payer: Self-pay | Admitting: Internal Medicine

## 2024-01-18 VITALS — BP 132/80 | HR 74 | Ht 70.0 in | Wt 203.6 lb

## 2024-01-18 DIAGNOSIS — G4733 Obstructive sleep apnea (adult) (pediatric): Secondary | ICD-10-CM

## 2024-01-18 DIAGNOSIS — K5100 Ulcerative (chronic) pancolitis without complications: Secondary | ICD-10-CM

## 2024-01-18 DIAGNOSIS — E559 Vitamin D deficiency, unspecified: Secondary | ICD-10-CM | POA: Diagnosis not present

## 2024-01-18 DIAGNOSIS — E782 Mixed hyperlipidemia: Secondary | ICD-10-CM | POA: Diagnosis not present

## 2024-01-18 DIAGNOSIS — Z23 Encounter for immunization: Secondary | ICD-10-CM | POA: Diagnosis not present

## 2024-01-18 DIAGNOSIS — Z0001 Encounter for general adult medical examination with abnormal findings: Secondary | ICD-10-CM | POA: Diagnosis not present

## 2024-01-18 MED ORDER — ATORVASTATIN CALCIUM 20 MG PO TABS: 20.0000 mg | ORAL_TABLET | Freq: Every day | ORAL | 3 refills | Status: DC

## 2024-01-18 NOTE — Assessment & Plan Note (Signed)
Influenza vaccine administered today.

## 2024-01-18 NOTE — Assessment & Plan Note (Signed)
Noted on recent labs.  Daily vitamin D supplementation was prescribed by his gastroenterologist (Dr. Levon Hedger).

## 2024-01-18 NOTE — Assessment & Plan Note (Signed)
Lipid panel updated last week.  Total cholesterol 278 and LDL 173.  Both values significantly increased from previous lipid panel in September 2023.  Through shared decision making, atorvastatin 20 mg daily was started today.  Dietary changes aimed at lowering his cholesterol were also reviewed.  He will return to care in 3 months for repeat lipid panel.

## 2024-01-18 NOTE — Assessment & Plan Note (Signed)
Followed by gastroenterology (Dr. Levon Hedger).  Symptoms remain well-controlled with Rinvoq.  Flexible sigmoidoscopy pending.

## 2024-01-18 NOTE — Progress Notes (Signed)
Complete physical exam  Patient: Gaylin Bulthuis   DOB: 1981-12-20   43 y.o. Male  MRN: [id number]  Subjective:    Chief Complaint  Patient presents with   Annual Exam    Dewell Alvillar is a 43 y.o. male who presents today for a complete physical exam. He reports consuming a general diet. The patient does not participate in regular exercise at present. He generally feels fairly well. He reports sleeping well. He does have additional problems to discuss today. He noticed that his cholesterol levels had increased on recent labwork.   Most recent fall risk assessment:    01/18/2024    3:51 PM  Fall Risk   Falls in the past year? 0  Number falls in past yr: 0  Injury with Fall? 0  Risk for fall due to : No Fall Risks  Follow up Falls evaluation completed     Most recent depression screenings:    01/18/2024    3:51 PM 06/07/2023    8:07 AM  PHQ 2/9 Scores  PHQ - 2 Score 0 0  PHQ- 9 Score 0 0    Vision:Not within last year  and Dental: Current dental problems and No regular dental care   Past Medical History:  Diagnosis Date   Acute midline thoracic back pain 12/26/2019   Adult acne 12/26/2019   Allergy    seasonal   Brain tumor (benign) (HCC)    Colitis, ulcerative (HCC)    Pancreatitis    Past Surgical History:  Procedure Laterality Date   BIOPSY  12/08/2022   Procedure: BIOPSY;  Surgeon: Dolores Frame, MD;  [facility];  Service: Gastroenterology;;   BRAIN SURGERY     COLONOSCOPY WITH PROPOFOL N/A 02/18/2020   Procedure: COLONOSCOPY WITH PROPOFOL;  Surgeon: Malissa Hippo, MD;  [facility];  Service: Endoscopy;  Laterality: N/A;   COLONOSCOPY WITH PROPOFOL N/A 12/08/2022   Procedure: COLONOSCOPY WITH PROPOFOL;  Surgeon: Dolores Frame, MD;  [facility]: [facility];  Service: Gastroenterology;  Laterality: N/A;  1:30pm, asa 1-2   POLYPECTOMY  12/08/2022   Procedure: POLYPECTOMY;  Surgeon: Marguerita Merles, Reuel Boom, MD;   Location: [facility];  Service: Gastroenterology;;   Social History   Tobacco Use   Smoking status: Some Days    Current packs/day: 0.50    Average packs/day: 0.5 packs/day for 18.0 years (9.0 ttl pk-yrs)    Types: E-cigarettes, Cigarettes    Last attempt to quit: 12/2020   Smokeless tobacco: Never  Vaping Use   Vaping status: Every Day   Last attempt to quit: 12/20/2020   Substances: Nicotine  Substance Use Topics   Alcohol use: Not Currently    Comment: rarely   Drug use: Not Currently    Comment: occassion use   Family History  Problem Relation Age of Onset   Hypertension Mother    Diabetes Mother    Healthy Father    Healthy Sister    No Known Allergies   Patient Care Team: Billie Lade, MD as PCP - General (Internal Medicine)   Outpatient Medications Prior to Visit  Medication Sig   Aspirin-Salicylamide-Caffeine (BC FAST PAIN RELIEF) 650-195-33.3 MG PACK Take 1 packet by mouth daily as needed (headaches/pain.).   Ergocalciferol 50 MCG (2000 UT) CAPS Take 1 each by mouth daily.   RINVOQ 30 MG TB24 TAKE 1 TABLET BY MOUTH 1 TIME A DAY.   No facility-administered medications prior to visit.   Review of  Systems  Constitutional:  Negative for chills and fever.  HENT:  Positive for congestion. Negative for sore throat.   Respiratory:  Negative for cough and shortness of breath.   Cardiovascular:  Negative for chest pain, palpitations and leg swelling.  Gastrointestinal:  Negative for abdominal pain, blood in stool, constipation, diarrhea, nausea and vomiting.  Genitourinary:  Negative for dysuria and hematuria.  Musculoskeletal:  Negative for myalgias.  Skin:  Negative for itching and rash.  Neurological:  Negative for dizziness and headaches.  Psychiatric/Behavioral:  Negative for depression and suicidal ideas.       Objective:     BP 132/80   Pulse 74   Ht 5\' 10"  (1.778 m)   Wt 203 lb 9.6 oz (92.4 kg)   SpO2 96%   BMI 29.21 kg/m  BP Readings from  Last 3 Encounters:  01/18/24 132/80  10/24/23 (!) 150/111  06/07/23 125/81   Physical Exam Vitals reviewed.  Constitutional:      General: He is not in acute distress.    Appearance: Normal appearance. He is not ill-appearing.  HENT:     Head: Normocephalic and atraumatic.     Right Ear: External ear normal.     Left Ear: External ear normal.     Nose: Nose normal. No congestion or rhinorrhea.     Mouth/Throat:     Mouth: Mucous membranes are moist.     Pharynx: Oropharynx is clear.  Eyes:     General: No scleral icterus.    Extraocular Movements: Extraocular movements intact.     Conjunctiva/sclera: Conjunctivae normal.     Pupils: Pupils are equal, round, and reactive to light.  Cardiovascular:     Rate and Rhythm: Normal rate and regular rhythm.     Pulses: Normal pulses.     Heart sounds: Normal heart sounds. No murmur heard. Pulmonary:     Effort: Pulmonary effort is normal.     Breath sounds: Normal breath sounds. No wheezing, rhonchi or rales.  Abdominal:     General: Abdomen is flat. Bowel sounds are normal. There is no distension.     Palpations: Abdomen is soft.     Tenderness: There is no abdominal tenderness.  Musculoskeletal:        General: No swelling or deformity. Normal range of motion.     Cervical back: Normal range of motion.  Skin:    General: Skin is warm and dry.     Capillary Refill: Capillary refill takes less than 2 seconds.  Neurological:     General: No focal deficit present.     Mental Status: He is alert and oriented to person, place, and time.     Motor: No weakness.  Psychiatric:        Mood and Affect: Mood normal.        Behavior: Behavior normal.        Thought Content: Thought content normal.    Last CBC Lab Results  Component Value Date   WBC 7.9 01/11/2024   HGB 14.7 01/11/2024   HCT 44.6 01/11/2024   MCV 82.0 01/11/2024   MCH 27.0 01/11/2024   RDW 13.7 01/11/2024   PLT 241 01/11/2024   Last metabolic panel Lab Results   Component Value Date   GLUCOSE 111 (H) 01/11/2024   NA 138 01/11/2024   K 4.0 01/11/2024   CL 102 01/11/2024   CO2 27 01/11/2024   BUN 19 01/11/2024   CREATININE 0.94 01/11/2024   EGFR 95 08/26/2022  CALCIUM 9.0 01/11/2024   PROT 7.4 01/11/2024   ALBUMIN 4.6 08/26/2022   LABGLOB 2.9 08/26/2022   AGRATIO 1.6 08/26/2022   BILITOT 0.7 01/11/2024   ALKPHOS 101 08/26/2022   AST 29 01/11/2024   ALT 58 (H) 01/11/2024   ANIONGAP 5 05/28/2021   Last lipids Lab Results  Component Value Date   CHOL 278 (H) 01/11/2024   HDL 64 01/11/2024   LDLCALC 173 (H) 01/11/2024   TRIG 248 (H) 01/11/2024   CHOLHDL 4.3 01/11/2024   Last hemoglobin A1c Lab Results  Component Value Date   HGBA1C 5.8 (H) 12/06/2022   Last thyroid functions Lab Results  Component Value Date   TSH 6.940 (H) 12/06/2022   Last vitamin D Lab Results  Component Value Date   VD25OH 13 (L) 01/11/2024   Last vitamin B12 and Folate Lab Results  Component Value Date   VITAMINB12 279 08/26/2022   FOLATE 12.0 08/26/2022      Assessment & Plan:    Routine Health Maintenance and Physical Exam  Immunization History  Administered Date(s) Administered   Influenza, Seasonal, Injecte, Preservative Fre 01/18/2024   Influenza,inj,Quad PF,6+ Mos 02/02/2019, 11/28/2019, 12/10/2020, 08/26/2022   Tdap 12/06/2022   Zoster Recombinant(Shingrix) 09/29/2022, 01/14/2023    Health Maintenance  Topic Date Due   Pneumococcal Vaccine 6-63 Years old (1 of 2 - PCV) Never done   COVID-19 Vaccine (1 - 2024-25 season) Never done   DTaP/Tdap/Td (2 - Td or Tdap) 12/06/2032   INFLUENZA VACCINE  Completed   Hepatitis C Screening  Completed   HIV Screening  Completed   HPV VACCINES  Aged Out    Discussed health benefits of physical activity, and encouraged him to engage in regular exercise appropriate for his age and condition.  Problem List Items Addressed This Visit       OSA (obstructive sleep apnea)   Moderate OSA  noted on sleep study from 2024.  Followed by pulmonology (Dr. Vassie Loll).  He was referred to orthodontics to discuss a dental appliance.  Today he reports that he was never contacted by orthodontics to schedule an appointment.  This was discussed today with pulmonology and the patient has been provided with contact information for orthodontics.      Ulcerative colitis (HCC)   Followed by gastroenterology (Dr. Levon Hedger).  Symptoms remain well-controlled with Rinvoq.  Flexible sigmoidoscopy pending.      Vitamin D deficiency   Noted on recent labs.  Daily vitamin D supplementation was prescribed by his gastroenterologist (Dr. Levon Hedger).      Hyperlipidemia - Primary   Lipid panel updated last week.  Total cholesterol 278 and LDL 173.  Both values significantly increased from previous lipid panel in September 2023.  Through shared decision making, atorvastatin 20 mg daily was started today.  Dietary changes aimed at lowering his cholesterol were also reviewed.  He will return to care in 3 months for repeat lipid panel.      Encounter for well adult exam with abnormal findings   Annual exam completed today.  Previous records and labs reviewed. -Recent labs from 1/22 reviewed today.  No additional labs ordered.  Vitamin D supplementation prescribed per gastroenterology.  Starting statin therapy in the setting of hyperlipidemia. -Influenza vaccine today.  Plan for PCV 20 at follow-up in 3 months. -Additional HM items are up-to-date -We will tentatively plan for follow-up in 3 months      Need for influenza vaccination   Influenza vaccine administered today  Return in about 3 months (around 04/17/2024) for HLD.  Billie Lade, MD

## 2024-01-18 NOTE — Patient Instructions (Signed)
It was a pleasure to see you today.  Thank you for giving [place] the opportunity to be involved in your care.  Below is a brief recap of your visit and next steps.  We will plan to see you again in 3 months.  Summary Annual physical today Start atorvastatin 20 mg daily for high cholesterol Flu shot today Will follow up on orthodontics referral Follow up in 3 months

## 2024-01-18 NOTE — Assessment & Plan Note (Signed)
Annual exam completed today.  Previous records and labs reviewed. -Recent labs from 1/22 reviewed today.  No additional labs ordered.  Vitamin D supplementation prescribed per gastroenterology.  Starting statin therapy in the setting of hyperlipidemia. -Influenza vaccine today.  Plan for PCV 20 at follow-up in 3 months. -Additional HM items are up-to-date -We will tentatively plan for follow-up in 3 months

## 2024-01-18 NOTE — Assessment & Plan Note (Signed)
Moderate OSA noted on sleep study from 2024.  Followed by pulmonology (Dr. Vassie Loll).  He was referred to orthodontics to discuss a dental appliance.  Today he reports that he was never contacted by orthodontics to schedule an appointment.  This was discussed today with pulmonology and the patient has been provided with contact information for orthodontics.

## 2024-02-17 ENCOUNTER — Ambulatory Visit: Payer: 59

## 2024-03-01 ENCOUNTER — Ambulatory Visit (INDEPENDENT_AMBULATORY_CARE_PROVIDER_SITE_OTHER)

## 2024-03-01 DIAGNOSIS — Z23 Encounter for immunization: Secondary | ICD-10-CM

## 2024-04-24 ENCOUNTER — Encounter (INDEPENDENT_AMBULATORY_CARE_PROVIDER_SITE_OTHER): Payer: Self-pay | Admitting: Gastroenterology

## 2024-05-17 ENCOUNTER — Other Ambulatory Visit (INDEPENDENT_AMBULATORY_CARE_PROVIDER_SITE_OTHER): Payer: Self-pay | Admitting: Gastroenterology

## 2024-05-17 DIAGNOSIS — K5100 Ulcerative (chronic) pancolitis without complications: Secondary | ICD-10-CM

## 2024-07-25 ENCOUNTER — Telehealth (INDEPENDENT_AMBULATORY_CARE_PROVIDER_SITE_OTHER): Payer: Self-pay

## 2024-07-25 NOTE — Telephone Encounter (Signed)
 I have called and left a Vm for the patient to return call back to the office regarding [facility] trying to reach him regarding his benefits investigation. Patient does get his Rinvoq  30 mg through them, but no medications are listed on the fax. Phone number the patient needs to call is [phone] for Salena Stank.   Fax received from [facility] stating they have been trying to reach the patient and have not been able to contact the patient.

## 2024-07-27 NOTE — Telephone Encounter (Signed)
 I left a detailed message that he would need to reach out to [facility] at [phone] as they sent a fax stating they have not been able to reach the patient. I advised if he had any questions he could call us  back.

## 2024-08-15 ENCOUNTER — Ambulatory Visit: Payer: 59 | Admitting: Dermatology

## 2024-10-01 ENCOUNTER — Other Ambulatory Visit: Payer: Self-pay

## 2024-10-01 DIAGNOSIS — E782 Mixed hyperlipidemia: Secondary | ICD-10-CM

## 2024-10-01 DIAGNOSIS — R7989 Other specified abnormal findings of blood chemistry: Secondary | ICD-10-CM

## 2024-10-01 MED ORDER — ERGOCALCIFEROL 50 MCG (2000 UT) PO CAPS: 1.0000 | ORAL_CAPSULE | Freq: Every day | ORAL | 3 refills | Status: AC

## 2024-10-01 MED ORDER — ATORVASTATIN CALCIUM 20 MG PO TABS: 20.0000 mg | ORAL_TABLET | Freq: Every day | ORAL | 3 refills | Status: AC

## 2024-10-01 NOTE — Telephone Encounter (Signed)
 Copied from CRM [id number]. Topic: Clinical - Medication Refill >> Oct 01, 2024  1:17 PM [name] wrote: Medication:  Cory James is out of town working and will not be back for another month or so. atorvastatin  (LIPITOR) 20 MG tablet Ergocalciferol  50 MCG (2000 UT) CAPS   Has the patient contacted their pharmacy? Yes (Agent: If no, request that the patient contact the pharmacy for the refill. If patient does not wish to contact the pharmacy document the reason why and proceed with request.) (Agent: If yes, when and what did the pharmacy advise?) Pharmacy needs an order to refill. Please send to new pharmacy in [place], [place]  This is the patient's preferred pharmacy:  [id number] [place] [place] - [address] AT [facility] [address] [place] [address] Phone: [phone] Fax: [phone]  Is this the correct pharmacy for this prescription? Yes If no, delete pharmacy and type the correct one.   Has the prescription been filled recently? No  Is the patient out of the medication? Yes  Has the patient been seen for an appointment in the last year OR does the patient have an upcoming appointment? Yes  Can we respond through MyChart? Yes  Agent: Please be advised that Rx refills may take up to 3 business days. We ask that you follow-up with your pharmacy.

## 2024-10-03 ENCOUNTER — Encounter (INDEPENDENT_AMBULATORY_CARE_PROVIDER_SITE_OTHER): Payer: Self-pay | Admitting: Gastroenterology

## 2024-11-02 ENCOUNTER — Other Ambulatory Visit (INDEPENDENT_AMBULATORY_CARE_PROVIDER_SITE_OTHER): Payer: Self-pay | Admitting: Gastroenterology

## 2024-11-02 DIAGNOSIS — K5100 Ulcerative (chronic) pancolitis without complications: Secondary | ICD-10-CM

## 2024-12-18 ENCOUNTER — Telehealth (INDEPENDENT_AMBULATORY_CARE_PROVIDER_SITE_OTHER): Payer: Self-pay

## 2024-12-18 NOTE — Telephone Encounter (Signed)
 PA on Patient's Rinvoq  30 mg came in for a renewal today 12/18/2024. Patient not seen since 10/24/2023. I have called the patient and left a message for him to call me at [phone] as he has not been seen in over a year, and in order for us  to refill script for his Rinvoq  he will need to reach out to us  as soon as possible to prevent any delays in his treatment.

## 2024-12-19 NOTE — Telephone Encounter (Signed)
 PA for Rinvoq  sent to plan with Records.

## 2024-12-19 NOTE — Telephone Encounter (Signed)
 This message was sent via FAXCOM, a product from [employer]. http://www.biscom.com/                    -------Fax Transmission Report-------  To:               Recipient at [phone] Subject:          Mable Blanch PA Rinvoq  Result:           The transmission was successful. Explanation:      All Pages Ok Pages Sent:       35 Connect Time:     19 minutes, 27 seconds Transmit Time:    12/19/2024 09:01 Transfer Rate:    14400 Status Code:      0000 Retry Count:      1 Job Id:           [id number] Unique Id:        [id number] Fax Line:         19 Fax Server:       [id number]

## 2024-12-21 NOTE — Telephone Encounter (Signed)
 I called again and left a detailed message for the patient to please call the office to schedule an appointment with the office to prevent any delays in his treatment with Rinvoq .

## 2024-12-24 DIAGNOSIS — K5100 Ulcerative (chronic) pancolitis without complications: Secondary | ICD-10-CM

## 2024-12-25 NOTE — Telephone Encounter (Signed)
 Patient must call office for further refills, last seen 10/2023, and Have tried calling patient multiple times to reach out for an office visit.

## 2024-12-25 NOTE — Telephone Encounter (Signed)
 Patient aware of OV and I have updated his new address and added him to the cancellation list.

## 2024-12-25 NOTE — Telephone Encounter (Signed)
 I spoke with the patient and made him aware, I did send in a refill for his Rinvoq  and he needed an appointment for further refills. Patient is scheduled here 02/07/2025, He has asked to be placed on a cancellation list. Devere please place on cancellation list to get in sooner than 02/08/2024 as I sent in a 30 day supply today 12/26/2023. He will keep his appointment here and will ask at that visit to be referred to a Gi specialist near where he has now moved to.

## 2025-01-11 NOTE — Telephone Encounter (Signed)
 01/11/2025: [facility] called today spoke with Mirna H with [facility]. She says they have had to cancel the patient's order for his Rinvoq  30 mg due to patient is showing an inactive insurance. They have tried calling the patient to get his updated information, but patient has yet to call them back with this information and they have cancelled the order for his Rinvoq  30 mg until the patient calls them back.   I have called the patient also, and advised that he would need to call [facility] at [phone] to give them his updated insurance so they can run through and see if the medication is covered.

## 2025-01-16 NOTE — Telephone Encounter (Signed)
 I spoke with the patient he says he reached out to [facility] and they told him that the doctors office would have to provide a number, but he did not know what number they were requiring from us . I asked the patient for his new insurance information. He has:   BCBS:Member ID: [id number] Rx bin: [id number] RX pcn: NA Rx Group: NA Pharmacy help desk phone number: [phone]  Provider number: [id number]. I advised the patient I would call them to see what I could do, but would more than likely need the missing information such as the RX Pcn, Rx Group.

## 2025-01-16 NOTE — Telephone Encounter (Signed)
 I have asked Jenkins with the front desk to see if she can pull the patient's actual insurance card.

## 2025-01-16 NOTE — Telephone Encounter (Signed)
 I have submitted the Prior Authorization to the patient [employer] plan, awaiting a determination.

## 2025-01-17 NOTE — Telephone Encounter (Signed)
 Pa still pending a determination.

## 2025-01-18 NOTE — Telephone Encounter (Signed)
 01/18/2025: I received a fax from [facility] stating they need additional information. I have filled out the forms to the best of my knowledge, and Charmaine Melia, NP, with come by the office to review and sign this form. Once this is done I will forward these forms along with Medical records to [facility] for a determination on patient Rinvoq  30 mg daily for the treatment of patient's Ulcerative colitis.

## 2025-01-18 NOTE — Telephone Encounter (Signed)
 Forms filled out as requested.   FYI - There is also some documentation in the chart under media that states approval for Rinvoq  30 mg tablets dated 01/15/2025 from [employer] and [facility].  If he is going to run out before his appointment 02/07/25 with [facility] then you can approve another one month supply and as long as he is complaint and presents for his OV further refills can be provided at that time.   Charmaine Melia, MSN, APRN, FNP-BC, AGACNP-BC [facility]

## 2025-01-21 ENCOUNTER — Encounter (INDEPENDENT_AMBULATORY_CARE_PROVIDER_SITE_OTHER): Payer: Self-pay

## 2025-01-22 NOTE — Telephone Encounter (Signed)
 This message was sent via FAXCOM, a product from [employer]. http://www.biscom.com/                    -------Fax Transmission Report-------  To:               Recipient at [phone] Subject:          M. Pensinger Result:           The transmission was successful. Explanation:      All Pages Ok Pages Sent:       32 Connect Time:     21 minutes, 24 seconds Transmit Time:    01/22/2025 10:43 Transfer Rate:    14400 Status Code:      0000 Retry Count:      0 Job Id:           [id number] Unique Id:        [id number] Fax Line:         33 Fax Server:       [id number]

## 2025-01-22 NOTE — Telephone Encounter (Signed)
 FYI - There is also some documentation in the chart under media that states approval for Rinvoq  30 mg tablets dated 01/15/2025 from [employer] and [facility]. (Yes, this was under his old insurance). 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 Thanks,

## 2025-01-23 NOTE — Telephone Encounter (Signed)
 There is an appeal available on Cover My Meds, Mitzie, if you don't mind can we get together tomorrow and submit it electronically?

## 2025-01-23 NOTE — Telephone Encounter (Signed)
 Per [employer], They have denied the patient's Rinvoq , stating the patient should have tried a preferred adalimumab for at least three months and it did not work.  Have tried a TNF, and could not be tolerated.   Have a medical reason why they cannot try all TNF inhibitors.  Have a provider explain why all TNF inhibitors is not clinically appropriate, and provide a complete list of all medications the member has tried in the past for this condition. The member must have tried entyvio , skyrizi, tremfya, or a preferred ustekinumab product,and it did not work. They state the medical records we sent did not confirm that the member had met one of the above criteria.   I did note on the PA, that the patient had tried and failed Mesalamine , Entyvio , predinsone, but patient has not tried anything else. Please advise.

## 2025-01-24 ENCOUNTER — Encounter (INDEPENDENT_AMBULATORY_CARE_PROVIDER_SITE_OTHER): Payer: Self-pay

## 2025-01-24 NOTE — Telephone Encounter (Signed)
"  °  °  [facility] Gastroenterology at [facility] [address] [place], [place]  [address] Phone:  [phone]   Fax:  [phone]     The following letter was faxed to [facility] Department at [phone].  January 24, 2025   [employer] [place] Attn: Copy Department   RE: Appeal of Coverage Denial for RINVOQ  (upadacitinib ) 30mg  Daily  Patient Name: Cory James               Date of Birth: 26-Jun-1981  Member ID: [id number]                                  Case Reference Number: [id number]   To the Medical Review Officer,   I am writing to formally appeal the denial of coverage for RINVOQ  30mg  daily for Cory James, a 44 year old male with a history of chronic pancolitis (Ulcerative Colitis). This medication is a critical continuation of therapy. The patient has been stable on RINVOQ  since April 2023 and has achieved significant clinical and endoscopic response.   The medical necessity of continuing RINVOQ  is supported by the patient's most recent diagnostic results: Last Colonoscopy (12/08/2022): Demonstrated Mayo 1 inflammation up to 60 cm from the anal verge. This represents a significant endoscopic response compared to the patient's history of chronic pancolitis. Pathology Findings: Biopsies from the cecum, ascending, transverse, descending, and sigmoid colon confirmed chronic inactive colitis. The sigmoid polyp was identified as a benign lymphoid aggregate, and the rectum showed hyperplastic changes. Treatment Failure: Before RINVOQ , the patient failed several therapies including mesalamine , Entyvio , and prednisone .  TB Screening: Patient tested negative for latent tuberculosis prior to therapy.    Cory James is currently on a maintenance dosage of RINVOQ . As the treating physician, I have noted that he has achieved significant symptom improvement. While he experiences occasional abdominal complaints likely related to coexistent IBS (for  which IBgard has been recommended), his inflammatory bowel disease is currently well-managed. My clinical plan is to continue RINVOQ  at the same dose to maintain this inactive disease state and perform a follow-up flexible sigmoidoscopy in late December to evaluate for further endoscopic improvement.   Cory James has been on long-term medication therapy for at least 2-1/2 years on RINVOQ . It would be medically inappropriate and clinically reckless to switch him to any other medication at this stage. Forced switching due to an insurance change risks a severe disease flare, loss of response to the JAK-inhibitor class, and potential hospitalization.   I request an immediate reversal of this denial to ensure there is no gap in this patient's established treatment.     Sincerely,     Mitzie Boettcher, NP NPI: [id number] "

## 2025-01-24 NOTE — Telephone Encounter (Signed)
 This message was sent via FAXCOM, a product from [employer]. [email or web address].biscom.com/                    -------Fax Transmission Report-------  To:               Recipient at [phone] Subject:          Expedite Appeal please on [employer] updated information included. Result:           The transmission was successful. Explanation:      All Pages Ok Pages Sent:       40 Connect Time:     24 minutes, 9 seconds Transmit Time:    01/24/2025 11:58 Transfer Rate:    14400 Status Code:      0000 Retry Count:      0 Job Id:           [id number] Unique Id:        [id number] Fax Line:         24 Fax Server:       [employer]

## 2025-01-25 ENCOUNTER — Encounter (INDEPENDENT_AMBULATORY_CARE_PROVIDER_SITE_OTHER): Payer: Self-pay | Admitting: Gastroenterology

## 2025-02-07 ENCOUNTER — Ambulatory Visit (INDEPENDENT_AMBULATORY_CARE_PROVIDER_SITE_OTHER): Admitting: Gastroenterology
